# Patient Record
Sex: Female | Born: 1970 | Race: White | Hispanic: No | Marital: Single | Smoking: Former smoker
Health system: Southern US, Community
[De-identification: ages and names within clinical notes are randomized; demographics above are authoritative.]

## PROBLEM LIST (undated history)

## (undated) DIAGNOSIS — C801 Malignant (primary) neoplasm, unspecified: Secondary | ICD-10-CM

## (undated) DIAGNOSIS — T4145XA Adverse effect of unspecified anesthetic, initial encounter: Secondary | ICD-10-CM

## (undated) DIAGNOSIS — W540XXA Bitten by dog, initial encounter: Secondary | ICD-10-CM

## (undated) DIAGNOSIS — IMO0002 Reserved for concepts with insufficient information to code with codable children: Secondary | ICD-10-CM

## (undated) DIAGNOSIS — R87619 Unspecified abnormal cytological findings in specimens from cervix uteri: Secondary | ICD-10-CM

## (undated) DIAGNOSIS — Z923 Personal history of irradiation: Secondary | ICD-10-CM

## (undated) HISTORY — PX: ABDOMINAL HYSTERECTOMY: SHX81

## (undated) HISTORY — PX: TUBAL LIGATION: SHX77

## (undated) HISTORY — PX: ROBOTIC ASSISTED LAP VAGINAL HYSTERECTOMY: SHX2362

## (undated) HISTORY — DX: Malignant (primary) neoplasm, unspecified: C80.1

## (undated) HISTORY — DX: Unspecified abnormal cytological findings in specimens from cervix uteri: R87.619

## (undated) HISTORY — DX: Reserved for concepts with insufficient information to code with codable children: IMO0002

## (undated) HISTORY — DX: Bitten by dog, initial encounter: W54.0XXA

---

## 2001-07-07 ENCOUNTER — Other Ambulatory Visit: Admission: RE | Admit: 2001-07-07 | Discharge: 2001-07-07 | Payer: Self-pay | Admitting: Obstetrics and Gynecology

## 2002-08-12 ENCOUNTER — Other Ambulatory Visit: Admission: RE | Admit: 2002-08-12 | Discharge: 2002-08-12 | Payer: Self-pay | Admitting: Obstetrics and Gynecology

## 2003-08-15 ENCOUNTER — Other Ambulatory Visit: Admission: RE | Admit: 2003-08-15 | Discharge: 2003-08-15 | Payer: Self-pay | Admitting: Obstetrics and Gynecology

## 2004-11-22 ENCOUNTER — Other Ambulatory Visit: Admission: RE | Admit: 2004-11-22 | Discharge: 2004-11-22 | Payer: Self-pay | Admitting: Obstetrics and Gynecology

## 2005-12-29 ENCOUNTER — Other Ambulatory Visit: Admission: RE | Admit: 2005-12-29 | Discharge: 2005-12-29 | Payer: Self-pay | Admitting: Obstetrics and Gynecology

## 2006-12-22 ENCOUNTER — Other Ambulatory Visit: Admission: RE | Admit: 2006-12-22 | Discharge: 2006-12-22 | Payer: Self-pay | Admitting: Obstetrics and Gynecology

## 2007-12-23 ENCOUNTER — Other Ambulatory Visit: Admission: RE | Admit: 2007-12-23 | Discharge: 2007-12-23 | Payer: Self-pay | Admitting: Obstetrics & Gynecology

## 2008-10-02 ENCOUNTER — Ambulatory Visit (HOSPITAL_COMMUNITY): Admission: RE | Admit: 2008-10-02 | Discharge: 2008-10-02 | Payer: Self-pay | Admitting: Obstetrics and Gynecology

## 2009-11-02 ENCOUNTER — Encounter: Admission: RE | Admit: 2009-11-02 | Discharge: 2009-11-02 | Payer: Self-pay | Admitting: Obstetrics and Gynecology

## 2009-11-06 ENCOUNTER — Encounter: Admission: RE | Admit: 2009-11-06 | Discharge: 2009-11-06 | Payer: Self-pay | Admitting: Obstetrics and Gynecology

## 2010-10-02 LAB — CBC
MCHC: 35 g/dL (ref 30.0–36.0)
Platelets: 250 10*3/uL (ref 150–400)

## 2010-11-05 NOTE — Op Note (Signed)
NAME:  Catherine Santos, Catherine Santos NO.:  [id number]   MEDICAL RECORD NO.:  [id number]          PATIENT TYPE:  AMB   LOCATION:  SDC                           FACILITY:  [facility]   PHYSICIAN:  Cynthia P. Romine, M.D.DATE OF BIRTH:  [birth year]   DATE OF PROCEDURE:  10/02/2008  DATE OF DISCHARGE:                               OPERATIVE REPORT   PREOPERATIVE DIAGNOSIS:  Multiparity with desire for attempt at  permanent surgical sterilization.   POSTOPERATIVE DIAGNOSIS:  Multiparity with desire for attempt at  permanent surgical sterilization.   PROCEDURE:  Falope ring laparoscopic bilateral tubal sterilization  procedure.   SURGEON:  Cynthia P. Romine, MD   ANESTHESIA:  General endotracheal.   ESTIMATED BLOOD LOSS:  Minimal.   COMPLICATIONS:  None.   PROCEDURE IN DETAIL:  The patient was taken to the operating room and  after induction of adequate general endotracheal anesthesia, was placed  in dorsal lithotomy position and prepped and draped in the usual  fashion.  The bladder was drained with a red rubber catheter.  A  posterior weighted, and anterior Sims retractor were placed, and a Hulka  uterine manipulator was placed.  Attention was next turned to the  abdomen, and area just inside the umbilicus was infiltrated with 0.25%  Marcaine with epinephrine, incised with a knife, and a Veress needle was  then inserted into peritoneal space.  Proper placement was tested by  noting a negative aspirate, and free flow of saline through the Veress  needle again with a negative aspirate, and then by noting the response  of a drop of saline placed at the hub of the Veress needle to negative  pressure as the abdominal wall was elevated.  Pneumoperitoneum was  created with the automatic insufflator using the 3 L of CO2.  An area  just over the symphysis in the midline was infiltrated with Marcaine,  incised with a knife, an 8-mm trocar was inserted under direct  visualization.  Prior to  inserting the suprapubic trocar and after  insufflation, a disposable bladed 10/11 trocar was inserted into  peritoneal space and proper placement noted with the laparoscope.  The  pelvis was inspected and there was diffuse endometriosis in the anterior  cul-de-sac.  The peritoneum had vesicular lesions all across the  anterior peritoneum.  The posterior cul-de-sac was obliterated.  There  were multiple adhesions especially of the cervix to the posterior  peritoneum and to the posterior leaf of broad ligament to the ovaries.  The tubes were surprisingly mobile.  There was an anterior fibroid  approximately 2-3 cm.  Both ovaries were resected.  The upper abdomen,  however, was normal.  The gallbladder appeared normal.  The stomach was  normal and the liver was smooth.  The uterus was freely mobile.  Anteriorly, it was just a posterior cul-de-sac that was primarily  dissected with the adhesions.  Both tubes could be identified and traced  to their fimbriated end.  On the right, the thinnest area of the tube  mid isthmic was elevated and a Falope ring was placed.  A good  knuckle  of tube was noted to be contained within the ring and there was good  blanching.  Photographic documentation was taken.  Procedure was  repeated on the patient's left, identifying the tube and tracing it to  its fimbriated end, and then some portions were elevated and the Falope  ring was placed.  A good knuckle of tube was noted to be contained  within the ring and good blanching was noted.  The laparoscopic  instruments were then removed from the abdomen and pneumoperitoneum was  allowed to escape.  The trocar sleeves were removed.  The incisions were  closed subcuticularly with 3-0 Vicryl Rapide.  There was some vaginal  bleeding noted when the manipulator was removed.  Inspection revealed  there to be some bleeding from the  tenaculum site.  This was controlled with a figure-of-eight suture of 2-  0 Vicryl.  The  procedure was terminated.  Instruments removed from  vagina.  The patient was taken to the recovery room in satisfactory  condition.  Sponge, needle, and instrument counts were correct.       Cynthia P. Romine, M.D.  Electronically Signed     CPR/MEDQ  D:  10/02/2008  T:  10/02/2008  Job:  [id number]

## 2011-12-09 ENCOUNTER — Encounter (HOSPITAL_COMMUNITY)
Admission: RE | Admit: 2011-12-09 | Discharge: 2011-12-09 | Disposition: A | Discharge status: Home / Self Care | Payer: BC Managed Care – PPO | Source: Ambulatory Visit | Attending: Obstetrics & Gynecology | Admitting: Obstetrics & Gynecology

## 2011-12-09 ENCOUNTER — Encounter (HOSPITAL_COMMUNITY): Payer: Self-pay

## 2011-12-09 ENCOUNTER — Encounter (HOSPITAL_COMMUNITY): Payer: Self-pay | Admitting: Pharmacy Technician

## 2011-12-09 DIAGNOSIS — Z01812 Encounter for preprocedural laboratory examination: Secondary | ICD-10-CM | POA: Insufficient documentation

## 2011-12-09 HISTORY — DX: Adverse effect of unspecified anesthetic, initial encounter: T41.45XA

## 2011-12-09 LAB — CBC
Hemoglobin: 12.9 g/dL (ref 12.0–15.0)
MCH: 31.5 pg (ref 26.0–34.0)
MCHC: 35.1 g/dL (ref 30.0–36.0)
MCV: 90 fL (ref 78.0–100.0)
Platelets: 252 10*3/uL (ref 150–400)
RDW: 12.3 % (ref 11.5–15.5)
WBC: 7.5 10*3/uL (ref 4.0–10.5)

## 2011-12-09 NOTE — Patient Instructions (Addendum)
YOUR PROCEDURE IS SCHEDULED ON:01/05/12  ENTER THROUGH THE MAIN ENTRANCE OF [facility] AT:6am  USE DESK PHONE AND DIAL [phone] TO INFORM us OF YOUR ARRIVAL  CALL [phone] IF YOU HAVE ANY QUESTIONS OR PROBLEMS PRIOR TO YOUR ARRIVAL.  REMEMBER: DO NOT EAT OR DRINK AFTER MIDNIGHT :Sunday   YOU MAY BRUSH YOUR TEETH THE MORNING OF SURGERY   TAKE THESE MEDICINES THE DAY OF SURGERY WITH SIP OF WATER:none   DO NOT WEAR JEWELRY, EYE MAKEUP, LIPSTICK OR DARK FINGERNAIL POLISH DO NOT WEAR LOTIONS  DO NOT SHAVE FOR 48 HOURS PRIOR TO SURGERY

## 2011-12-24 DIAGNOSIS — N938 Other specified abnormal uterine and vaginal bleeding: Secondary | ICD-10-CM | POA: Diagnosis present

## 2011-12-24 DIAGNOSIS — N809 Endometriosis, unspecified: Secondary | ICD-10-CM | POA: Diagnosis present

## 2011-12-24 DIAGNOSIS — R102 Pelvic and perineal pain: Secondary | ICD-10-CM | POA: Diagnosis present

## 2011-12-29 MED ORDER — METRONIDAZOLE IN NACL 5-0.79 MG/ML-% IV SOLN
500.0000 mg | INTRAVENOUS | Status: AC
  Administered 2011-12-30: .5 g via INTRAVENOUS
  Filled 2011-12-29: qty 100

## 2011-12-29 MED ORDER — CIPROFLOXACIN IN D5W 400 MG/200ML IV SOLN
400.0000 mg | INTRAVENOUS | Status: AC
  Administered 2011-12-30: 400 mg via INTRAVENOUS
  Filled 2011-12-29: qty 200

## 2011-12-30 ENCOUNTER — Encounter (HOSPITAL_COMMUNITY): Payer: Self-pay | Admitting: Anesthesiology

## 2011-12-30 ENCOUNTER — Encounter (HOSPITAL_COMMUNITY): Payer: Self-pay | Admitting: *Deleted

## 2011-12-30 ENCOUNTER — Ambulatory Visit (HOSPITAL_COMMUNITY): Payer: BC Managed Care – PPO | Admitting: Anesthesiology

## 2011-12-30 ENCOUNTER — Ambulatory Visit (HOSPITAL_COMMUNITY)
Admission: RE | Admit: 2011-12-30 | Discharge: 2011-12-30 | Disposition: A | Discharge status: Home / Self Care | Payer: BC Managed Care – PPO | Source: Ambulatory Visit | Attending: Obstetrics & Gynecology | Admitting: Obstetrics & Gynecology

## 2011-12-30 ENCOUNTER — Encounter (HOSPITAL_COMMUNITY)
Admission: RE | Disposition: A | Discharge status: Home / Self Care | Payer: Self-pay | Source: Ambulatory Visit | Attending: Obstetrics & Gynecology

## 2011-12-30 DIAGNOSIS — N938 Other specified abnormal uterine and vaginal bleeding: Secondary | ICD-10-CM | POA: Diagnosis present

## 2011-12-30 DIAGNOSIS — N920 Excessive and frequent menstruation with regular cycle: Secondary | ICD-10-CM | POA: Insufficient documentation

## 2011-12-30 DIAGNOSIS — R102 Pelvic and perineal pain: Secondary | ICD-10-CM | POA: Diagnosis present

## 2011-12-30 DIAGNOSIS — N8003 Adenomyosis of the uterus: Secondary | ICD-10-CM | POA: Diagnosis present

## 2011-12-30 DIAGNOSIS — Z01812 Encounter for preprocedural laboratory examination: Secondary | ICD-10-CM | POA: Insufficient documentation

## 2011-12-30 DIAGNOSIS — N809 Endometriosis, unspecified: Secondary | ICD-10-CM | POA: Diagnosis present

## 2011-12-30 DIAGNOSIS — N8000 Endometriosis of the uterus, unspecified: Secondary | ICD-10-CM | POA: Insufficient documentation

## 2011-12-30 DIAGNOSIS — Z01818 Encounter for other preprocedural examination: Secondary | ICD-10-CM | POA: Insufficient documentation

## 2011-12-30 DIAGNOSIS — N946 Dysmenorrhea, unspecified: Secondary | ICD-10-CM | POA: Insufficient documentation

## 2011-12-30 HISTORY — PX: SALPINGOOPHORECTOMY: SHX82

## 2011-12-30 HISTORY — PX: CYSTOSCOPY: SHX5120

## 2011-12-30 SURGERY — ROBOTIC ASSISTED TOTAL HYSTERECTOMY
Anesthesia: General | Site: Bladder | Laterality: Right | Wound class: Clean Contaminated

## 2011-12-30 MED ORDER — HYDROMORPHONE HCL PF 1 MG/ML IJ SOLN
INTRAMUSCULAR | Status: AC
  Filled 2011-12-30: qty 1

## 2011-12-30 MED ORDER — ACETAMINOPHEN 325 MG PO TABS: 650.0000 mg | ORAL_TABLET | ORAL | Status: DC | PRN

## 2011-12-30 MED ORDER — ROCURONIUM BROMIDE 100 MG/10ML IV SOLN
INTRAVENOUS | Status: DC | PRN
  Administered 2011-12-30: 50 mg via INTRAVENOUS
  Administered 2011-12-30: 10 mg via INTRAVENOUS
  Administered 2011-12-30: 20 mg via INTRAVENOUS
  Administered 2011-12-30: 10 mg via INTRAVENOUS

## 2011-12-30 MED ORDER — HYDROMORPHONE HCL PF 1 MG/ML IJ SOLN
INTRAMUSCULAR | Status: DC | PRN
  Administered 2011-12-30: 1 mg via INTRAVENOUS

## 2011-12-30 MED ORDER — LACTATED RINGERS IR SOLN
Status: DC | PRN
  Administered 2011-12-30: 3000 mL

## 2011-12-30 MED ORDER — ROPIVACAINE HCL 5 MG/ML IJ SOLN
INTRAMUSCULAR | Status: DC | PRN
  Administered 2011-12-30: 60 mL

## 2011-12-30 MED ORDER — ONDANSETRON HCL 4 MG/2ML IJ SOLN
INTRAMUSCULAR | Status: AC
  Filled 2011-12-30: qty 2

## 2011-12-30 MED ORDER — MIDAZOLAM HCL 2 MG/2ML IJ SOLN
INTRAMUSCULAR | Status: AC
  Filled 2011-12-30: qty 2

## 2011-12-30 MED ORDER — PROPOFOL 10 MG/ML IV EMUL
INTRAVENOUS | Status: AC
  Filled 2011-12-30: qty 20

## 2011-12-30 MED ORDER — SODIUM CHLORIDE 0.9 % IJ SOLN
INTRAMUSCULAR | Status: DC | PRN
  Administered 2011-12-30: 60 mL

## 2011-12-30 MED ORDER — HYDROMORPHONE HCL PF 1 MG/ML IJ SOLN
0.2500 mg | INTRAMUSCULAR | Status: DC | PRN
  Administered 2011-12-30: 0.25 mg via INTRAVENOUS

## 2011-12-30 MED ORDER — DEXAMETHASONE SODIUM PHOSPHATE 10 MG/ML IJ SOLN
INTRAMUSCULAR | Status: AC
  Filled 2011-12-30: qty 1

## 2011-12-30 MED ORDER — KETOROLAC TROMETHAMINE 30 MG/ML IJ SOLN: 30.0000 mg | Freq: Four times a day (QID) | INTRAMUSCULAR | Status: DC

## 2011-12-30 MED ORDER — GLYCOPYRROLATE 0.2 MG/ML IJ SOLN
INTRAMUSCULAR | Status: AC
  Filled 2011-12-30: qty 1

## 2011-12-30 MED ORDER — ROPIVACAINE HCL 5 MG/ML IJ SOLN
INTRAMUSCULAR | Status: AC
  Filled 2011-12-30: qty 60

## 2011-12-30 MED ORDER — DEXAMETHASONE SODIUM PHOSPHATE 4 MG/ML IJ SOLN
INTRAMUSCULAR | Status: DC | PRN
  Administered 2011-12-30: 5 mg via INTRAVENOUS

## 2011-12-30 MED ORDER — SCOPOLAMINE 1 MG/3DAYS TD PT72
1.0000 | MEDICATED_PATCH | TRANSDERMAL | Status: DC
  Administered 2011-12-30: 1.5 mg via TRANSDERMAL

## 2011-12-30 MED ORDER — STERILE WATER FOR IRRIGATION IR SOLN
Status: DC | PRN
  Administered 2011-12-30: 1000 mL via INTRAVESICAL

## 2011-12-30 MED ORDER — LACTATED RINGERS IV SOLN
INTRAVENOUS | Status: DC
  Administered 2011-12-30 (×3): via INTRAVENOUS

## 2011-12-30 MED ORDER — DEXTROSE-NACL 5-0.45 % IV SOLN
INTRAVENOUS | Status: DC
  Administered 2011-12-30: 19:00:00 via INTRAVENOUS

## 2011-12-30 MED ORDER — LIDOCAINE HCL (CARDIAC) 20 MG/ML IV SOLN
INTRAVENOUS | Status: DC | PRN
  Administered 2011-12-30: 80 mg via INTRAVENOUS

## 2011-12-30 MED ORDER — KETOROLAC TROMETHAMINE 30 MG/ML IJ SOLN
INTRAMUSCULAR | Status: DC | PRN
  Administered 2011-12-30: 30 mg via INTRAVENOUS

## 2011-12-30 MED ORDER — NEOSTIGMINE METHYLSULFATE 1 MG/ML IJ SOLN
INTRAMUSCULAR | Status: DC | PRN
  Administered 2011-12-30: 3 mg via INTRAVENOUS

## 2011-12-30 MED ORDER — PANTOPRAZOLE SODIUM 40 MG IV SOLR
40.0000 mg | Freq: Every day | INTRAVENOUS | Status: DC
  Filled 2011-12-30: qty 40

## 2011-12-30 MED ORDER — KETOROLAC TROMETHAMINE 30 MG/ML IJ SOLN
INTRAMUSCULAR | Status: AC
  Filled 2011-12-30: qty 1

## 2011-12-30 MED ORDER — MENTHOL 3 MG MT LOZG: 1.0000 | LOZENGE | OROMUCOSAL | Status: DC | PRN

## 2011-12-30 MED ORDER — GLYCOPYRROLATE 0.2 MG/ML IJ SOLN
INTRAMUSCULAR | Status: DC | PRN
  Administered 2011-12-30: 0.4 mg via INTRAVENOUS

## 2011-12-30 MED ORDER — ONDANSETRON HCL 4 MG/2ML IJ SOLN
INTRAMUSCULAR | Status: DC | PRN
  Administered 2011-12-30: 4 mg via INTRAVENOUS

## 2011-12-30 MED ORDER — MIDAZOLAM HCL 5 MG/5ML IJ SOLN
INTRAMUSCULAR | Status: DC | PRN
  Administered 2011-12-30: 2 mg via INTRAVENOUS

## 2011-12-30 MED ORDER — INDIGOTINDISULFONATE SODIUM 8 MG/ML IJ SOLN
INTRAMUSCULAR | Status: AC
  Filled 2011-12-30: qty 5

## 2011-12-30 MED ORDER — LIDOCAINE HCL (CARDIAC) 20 MG/ML IV SOLN
INTRAVENOUS | Status: AC
  Filled 2011-12-30: qty 5

## 2011-12-30 MED ORDER — MEPERIDINE HCL 25 MG/ML IJ SOLN: 6.2500 mg | INTRAMUSCULAR | Status: DC | PRN

## 2011-12-30 MED ORDER — OXYCODONE-ACETAMINOPHEN 5-325 MG PO TABS: 1.0000 | ORAL_TABLET | ORAL | Status: DC | PRN

## 2011-12-30 MED ORDER — SIMETHICONE 80 MG PO CHEW: 80.0000 mg | CHEWABLE_TABLET | Freq: Four times a day (QID) | ORAL | Status: DC | PRN

## 2011-12-30 MED ORDER — FENTANYL CITRATE 0.05 MG/ML IJ SOLN
INTRAMUSCULAR | Status: DC | PRN
  Administered 2011-12-30: 50 ug via INTRAVENOUS
  Administered 2011-12-30: 100 ug via INTRAVENOUS
  Administered 2011-12-30 (×2): 50 ug via INTRAVENOUS

## 2011-12-30 MED ORDER — TEMAZEPAM 15 MG PO CAPS: 15.0000 mg | ORAL_CAPSULE | Freq: Every evening | ORAL | Status: DC | PRN

## 2011-12-30 MED ORDER — FENTANYL CITRATE 0.05 MG/ML IJ SOLN
INTRAMUSCULAR | Status: AC
  Filled 2011-12-30: qty 5

## 2011-12-30 MED ORDER — MORPHINE SULFATE 4 MG/ML IJ SOLN: 1.0000 mg | INTRAMUSCULAR | Status: DC | PRN

## 2011-12-30 MED ORDER — ALUM & MAG HYDROXIDE-SIMETH 200-200-20 MG/5ML PO SUSP: 30.0000 mL | ORAL | Status: DC | PRN

## 2011-12-30 MED ORDER — ROCURONIUM BROMIDE 50 MG/5ML IV SOLN
INTRAVENOUS | Status: AC
  Filled 2011-12-30: qty 1

## 2011-12-30 MED ORDER — SCOPOLAMINE 1 MG/3DAYS TD PT72
MEDICATED_PATCH | TRANSDERMAL | Status: AC
  Administered 2011-12-30: 1.5 mg via TRANSDERMAL
  Filled 2011-12-30: qty 1

## 2011-12-30 MED ORDER — METOCLOPRAMIDE HCL 5 MG/ML IJ SOLN: 10.0000 mg | Freq: Once | INTRAMUSCULAR | Status: DC | PRN

## 2011-12-30 MED ORDER — PROPOFOL 10 MG/ML IV EMUL
INTRAVENOUS | Status: DC | PRN
  Administered 2011-12-30: 200 mg via INTRAVENOUS
  Administered 2011-12-30: 50 mg via INTRAVENOUS

## 2011-12-30 SURGICAL SUPPLY — 68 items
ADH SKN CLS APL DERMABOND .7 (GAUZE/BANDAGES/DRESSINGS) ×4
APL SKNCLS STERI-STRIP NONHPOA (GAUZE/BANDAGES/DRESSINGS)
BAG URINE DRAINAGE (UROLOGICAL SUPPLIES) ×5 IMPLANT
BARRIER ADHS 3X4 INTERCEED (GAUZE/BANDAGES/DRESSINGS) ×10 IMPLANT
BENZOIN TINCTURE PRP APPL 2/3 (GAUZE/BANDAGES/DRESSINGS) IMPLANT
BRR ADH 4X3 ABS CNTRL BYND (GAUZE/BANDAGES/DRESSINGS) ×8
CABLE HIGH FREQUENCY MONO STRZ (ELECTRODE) ×5 IMPLANT
CATH FOLEY 3WAY  5CC 16FR (CATHETERS)
CATH FOLEY 3WAY 5CC 16FR (CATHETERS) IMPLANT
CHLORAPREP W/TINT 26ML (MISCELLANEOUS) ×5 IMPLANT
CLOTH BEACON ORANGE TIMEOUT ST (SAFETY) ×5 IMPLANT
CONT PATH 16OZ SNAP LID 3702 (MISCELLANEOUS) ×5 IMPLANT
COVER MAYO STAND STRL (DRAPES) ×5 IMPLANT
COVER TABLE BACK 60X90 (DRAPES) ×10 IMPLANT
COVER TIP SHEARS 8 DVNC (MISCELLANEOUS) ×8 IMPLANT
COVER TIP SHEARS 8MM DA VINCI (MISCELLANEOUS) ×2
DECANTER SPIKE VIAL GLASS SM (MISCELLANEOUS) ×5 IMPLANT
DERMABOND ADVANCED (GAUZE/BANDAGES/DRESSINGS) ×1
DERMABOND ADVANCED .7 DNX12 (GAUZE/BANDAGES/DRESSINGS) ×4 IMPLANT
DRAPE HUG U DISPOSABLE (DRAPE) ×5 IMPLANT
DRAPE LG THREE QUARTER DISP (DRAPES) ×10 IMPLANT
DRAPE MONITOR DA VINCI (DRAPE) IMPLANT
DRAPE PROXIMA HALF (DRAPES) ×5 IMPLANT
DRAPE WARM FLUID 44X44 (DRAPE) ×5 IMPLANT
ELECT REM PT RETURN 9FT ADLT (ELECTROSURGICAL) ×5
ELECTRODE REM PT RTRN 9FT ADLT (ELECTROSURGICAL) ×4 IMPLANT
EVACUATOR SMOKE 8.L (FILTER) ×5 IMPLANT
GAUZE VASELINE 3X9 (GAUZE/BANDAGES/DRESSINGS) ×5 IMPLANT
GLOVE BIOGEL PI IND STRL 7.0 (GLOVE) ×32 IMPLANT
GLOVE BIOGEL PI INDICATOR 7.0 (GLOVE) ×8
GLOVE ECLIPSE 6.5 STRL STRAW (GLOVE) ×25 IMPLANT
GLOVE SURG SS PI 7.0 STRL IVOR (GLOVE) ×15 IMPLANT
GOWN STRL REIN XL XLG (GOWN DISPOSABLE) ×40 IMPLANT
KIT ACCESSORY DA VINCI DISP (KITS) ×1
KIT ACCESSORY DVNC DISP (KITS) ×4 IMPLANT
KIT DISP ACCESSORY 4 ARM (KITS) IMPLANT
LEGGING LITHOTOMY PAIR STRL (DRAPES) ×10 IMPLANT
NEEDLE INSUFFLATION 14GA 120MM (NEEDLE) ×5 IMPLANT
OCCLUDER COLPOPNEUMO (BALLOONS) ×5 IMPLANT
PACK LAVH (CUSTOM PROCEDURE TRAY) ×5 IMPLANT
PAD PREP 24X48 CUFFED NSTRL (MISCELLANEOUS) ×10 IMPLANT
PLUG CATH AND CAP STER (CATHETERS) ×5 IMPLANT
PROTECTOR NERVE ULNAR (MISCELLANEOUS) ×10 IMPLANT
SET CYSTO W/LG BORE CLAMP LF (SET/KITS/TRAYS/PACK) ×5 IMPLANT
SET IRRIG TUBING LAPAROSCOPIC (IRRIGATION / IRRIGATOR) ×5 IMPLANT
SOLUTION ELECTROLUBE (MISCELLANEOUS) ×5 IMPLANT
SPONGE LAP 18X18 X RAY DECT (DISPOSABLE) IMPLANT
STRIP CLOSURE SKIN 1/4X4 (GAUZE/BANDAGES/DRESSINGS) IMPLANT
SUT VIC AB 0 CT1 27 (SUTURE) ×20
SUT VIC AB 0 CT1 27XBRD ANBCTR (SUTURE) ×20 IMPLANT
SUT VICRYL 0 UR6 27IN ABS (SUTURE) ×5 IMPLANT
SUT VICRYL RAPIDE 4/0 PS 2 (SUTURE) ×10 IMPLANT
SUT VLOC 180 0 9IN  GS21 (SUTURE) ×1
SUT VLOC 180 0 9IN GS21 (SUTURE) ×4 IMPLANT
SYR 50ML LL SCALE MARK (SYRINGE) ×5 IMPLANT
SYSTEM CONVERTIBLE TROCAR (TROCAR) IMPLANT
TIP UTERINE 5.1X6CM LAV DISP (MISCELLANEOUS) IMPLANT
TIP UTERINE 6.7X10CM GRN DISP (MISCELLANEOUS) IMPLANT
TIP UTERINE 6.7X6CM WHT DISP (MISCELLANEOUS) IMPLANT
TIP UTERINE 6.7X8CM BLUE DISP (MISCELLANEOUS) ×5 IMPLANT
TOWEL OR 17X24 6PK STRL BLUE (TOWEL DISPOSABLE) ×10 IMPLANT
TROCAR DISP BLADELESS 8 DVNC (TROCAR) ×4 IMPLANT
TROCAR DISP BLADELESS 8MM (TROCAR) ×1
TROCAR XCEL NON-BLD 5MMX100MML (ENDOMECHANICALS) ×5 IMPLANT
TROCAR Z-THREAD 12X150 (TROCAR) ×5 IMPLANT
TUBING FILTER THERMOFLATOR (ELECTROSURGICAL) ×5 IMPLANT
WARMER LAPAROSCOPE (MISCELLANEOUS) ×5 IMPLANT
WATER STERILE IRR 1000ML POUR (IV SOLUTION) ×15 IMPLANT

## 2011-12-30 NOTE — Progress Notes (Signed)
Day of Surgery Procedure(s) (LRB): ROBOTIC ASSISTED TOTAL HYSTERECTOMY (N/A) SALPINGO OOPHERECTOMY (Left) UNILATERAL SALPINGECTOMY (Right) CYSTOSCOPY (N/A)  Subjective: Patient reports tolerating PO.  Good pain control.  No nausea.  Has not been able to void yet.  Objective: I have reviewed patient's vital signs, intake and output and medications.  General: alert and cooperative Resp: clear to auscultation bilaterally Cardio: regular rate and rhythm, S1, S2 normal, no murmur, click, rub or gallop GI: soft, non-tender; bowel sounds normal; no masses,  no organomegaly Extremities: extremities normal, atraumatic, no cyanosis or edema Vaginal Bleeding: none Inc:  clean/dry/intact  Assessment: s/p Procedure(s) (LRB): ROBOTIC ASSISTED TOTAL HYSTERECTOMY (N/A) SALPINGO OOPHERECTOMY (Left) UNILATERAL SALPINGECTOMY (Right) CYSTOSCOPY (N/A): stable, progressing well and tolerating diet  Plan: Encourage ambulation Advance to PO medication Discontinue IV fluids Discharge home later tonight if patient meets all criteria for discharge.  LOS: 0 days    Valentina Shaggy [facility] 12/30/2011, 6:30 PM

## 2011-12-30 NOTE — Anesthesia Postprocedure Evaluation (Signed)
  Anesthesia Post-op Note  Patient: Catherine Santos  Procedure(s) Performed: Procedure(s) (LRB): ROBOTIC ASSISTED TOTAL HYSTERECTOMY (N/A) SALPINGO OOPHERECTOMY (Left) UNILATERAL SALPINGECTOMY (Right) CYSTOSCOPY (N/A) Patient is awake and responsive. Pain and nausea are reasonably well controlled. Vital signs are stable and clinically acceptable. Oxygen saturation is clinically acceptable. There are no apparent anesthetic complications at this time. Patient is ready for discharge.

## 2011-12-30 NOTE — Progress Notes (Signed)
After several attempts to page Dr. Hyacinth Meeker, unable to talk with MD until now.  Patient discharged home with mother.  Discharge instructions reviewed with patient and mother, verbalized understanding of instructions.  D/C home via wheel chair.  Denies any pain at this time.

## 2011-12-30 NOTE — Transfer of Care (Signed)
Immediate Anesthesia Transfer of Care Note  Patient: Catherine Santos  Procedure(s) Performed: Procedure(s) (LRB): ROBOTIC ASSISTED TOTAL HYSTERECTOMY (N/A) SALPINGO OOPHERECTOMY (Left) UNILATERAL SALPINGECTOMY (Right) CYSTOSCOPY (N/A)  Patient Location: PACU  Anesthesia Type: General  Level of Consciousness: awake, alert  and oriented  Airway & Oxygen Therapy: Patient Spontanous Breathing and Patient connected to nasal cannula oxygen  Post-op Assessment: Report given to PACU RN and Post -op Vital signs reviewed and stable  Post vital signs: stable  Complications: No apparent anesthesia complications

## 2011-12-30 NOTE — Op Note (Signed)
12/30/2011  3:12 PM  PATIENT:  Catherine Santos  41 y.o. female G2P1 SWF with history of menorrhagia, dysmenorrhea, known history of endometriosis, findings consistent with adenomyosis noted on in-office ultrasound  PRE-OPERATIVE DIAGNOSIS:  ENDOMETRIOSIS, Dysfunctional Uterine Bleeding   POST-OPERATIVE DIAGNOSIS:  ENDOMETRIOSIS, Dysfunctional Uterine Bleeding   PROCEDURE:  Procedure(s): ROBOTIC ASSISTED TOTAL HYSTERECTOMY SALPINGO OOPHERECTOMY UNILATERAL SALPINGECTOMY CYSTOSCOPY  SURGEON:  Ermagene Saidi SUZANNE  ASSISTANTS: CYNTHIA ROMINE, MC   ANESTHESIA:   general  ESTIMATED BLOOD LOSS: 100cc  BLOOD ADMINISTERED:none   FLUIDS: 1500ccLR  UOP: 650cc clear urine  SPECIMEN:  Uterus, cervix, right salpingectomy, left salpingo-oophrectomy, cystocopy  DISPOSITION OF SPECIMEN:  PATHOLOGY  FINDINGS: endometriosis incorporating the left tube and ovary with adhesions to the left sidewall and left side of the uterus, cul de sac adhesions, two small areas on the peritoneum, normal right ovary and tube, normal bladder, normal upper abdomen including liver, stomach edge, and gall bladder  DESCRIPTION OF OPERATION: Patient is taken to the operating room. She was placed in supine position. General endotracheal anesthesia was administered by the anesthesia staff without difficulty. The patient was on a beanbag. After anesthesia was administered on her by her side and the beanbag was inflated. Legs are positioned in the low lithotomy position in [place] stirrups. SCDs were functioning properly and on her lower extremities bilaterally.  Timeout was performed. Chlor prep was used to prep the abdomen and Betadine was used to prep the inner thighs perineum, and vagina x3. Once 3 minutes had passed the patient was draped in a normal standard fashion. Legs were lifted to the high lithotomy position and attention was turned towards the vagina. A heavy weighted speculum was placed in the posterior aspect of  the vagina. The anterior lip of the cervix was grasped with single-tooth tenaculum. A single stitch of 2-0 Vicryl was placed on each side of the cervix. The cervix sounded to 9 cm. Using [facility] dilators the cervix was dilated up to a #21. A #8 disposable tip was obtained. This was attached to the RUMI uterine manipulator as well as a vaginal occlusive device. A medium KOH ring was also attached. The cell advanced to the cervical os and into the endometrial cavity. 10 cc of normal saline was used to inflate the balloon on the disposable tip. Was excellent fit around the cervix. The 2 sutures on each side of the cervix were then brought through the KOH ring as a means of retraction on the cervix and uterus during the procedure. The heavy weighted speculum was removed as well as the single-tooth tenaculum. A Foley was placed to straight drain. Legs were lowered to the low lithotomy position and attention was turned to the abdomen. Next  0.5% ropivacaine mixture (mixed one-to-one with normal saline) was used anesthetize the skin. A 10 mm skin incision was made above the umbilicus with a #11 blade. The abdomen was elevated and a Veress needle was inserted directly into the abdomen. The stopcock was open. A syringe of normal saline was attached to the needle and an aspiration was performed. No blood or fluid was noted. Fluid injected easily into the needle. A second aspiration was performed. No blood, fluid, or saline was noted. Fluid dripped easily into the needle. A syringe was removed and CO2 gas was attached. Under low flows a pneumoperitoneum was achieved. Once 3 L of gas was in the abdomen the needle was removed. With the abdomen elevated a #11 bladed trocar port passed rectally into the abdomen  without difficulty. The laparoscope was used to confirm excellent placement without any injury to bowel or other pelvic organs.  The abdominal wall was transilluminated and port location sites were chosen. A #1 site was  chosen 10 cm to the right of the umbilicus and #2 site was chosen 10 cm to the left of the umbilicus. These locations were anesthetized with ropivacaine mixture and 8mm skin incisions were made with a #11 blade. Then 2 #8 nondisposable trochars and ports were placed in these incisions. These were to be used for the #1 and #2 arms of the robotic arm. Finally a right lower quadrant port site was chosen. The skin was anesthetized after transillumination. A 5 mm skin incision was made with a #11 blade and a 5 mm disposable non-bladed trocar port passed under direct visualization of the laparoscope into the right lower quadrant. The patient's table was then placed on the floor and she was placed in Trendelenburg positioning. The robot was docked on the left side a normal standard fashion.  And a #1 arm was placed endoscopic scissors with monopolar cautery attached and then the #2 arm was placed bipolar cautery attached a PK [place]. The ureters were noted bilaterally. The right ovary and tube were freely mobile. There were adhesions from the posterior cul-de-sac to the backside of the cervix. The left ovary and tube were adhesed left sidewall. The uterus placed on stretch to the right and attention was turned to the left side as this was obviously more difficult side. The left round ligament was serially clamped cauterized and incised. The from the round ligament slightly above the IP ligament was incised. The avascular tissue beneath this was dissected until ureter was noted. Then the left IP ligament was isolated. It was clamped cauterized and incised with bipolar cautery. With careful dissection the left ovary and tube were dissected off the left sidewall keeping note for the ureter was located. Then the peritoneum of the inferior leaf of the broad ligament was opened anteriorly bringing this across to the midline of the cervix. The beginning of the bladder flap was created by dissecting into the avascular plane of  the pubovesicocervical fascia. The white glistening tissue of the cervix was well visualized. The posterior peritoneum was incorporated partly into this adhesion of the posterior cul-de-sac to the posterior aspect of the uterus. This was taken down sharply. The colon was not close to this dissection. Then the left uterine artery to be skeletonized. Once it was skeletonized, it was clamped at the upper edge of the KOH ring. In several locations the uterine artery was clamped cauterized and then ultimately incised. There was excellent hemostasis as pedicle. At this point the left side was completely freed and attention was turned to the right side. Next  The uterus placed on stretch to the left and the ureter was noted on the right side. The tube was elevated and the tube was excised off of the ovary with monopolar cautery and sharp dissection as necessary. In the mesosalpinx was serially clamped cauterized and incised up to the level of the cornu of the uterus freeing the tube from the ovary completely. In the right uterine ovarian pedicle was serially clamped cauterized and incised. And the right round ligament was then serially clamped, cauterized, and incised. The inferiorly for the broad ligament on the side was opened anteriorly down to the level of the internal os of the cervix. The remainder of the bladder flap was created. In the posterior peritoneum was  carried down to the uterosacral ligament on the right side. There were also adhesions from the cul-de-sac up to the posterior aspect of cervix. These were taken down sharply and again we were not anywhere near the colon. At this point bulging of the vaginal mucosa due to the KOH ring was noted circumferentially around the cervix. Attention was then turned back to the right side of the cervix and the right uterine artery was skeletonized. At the upper edge of the KOH ring this vessel was serially clamped cauterized and incised. Excellent hemostasis was noted  on this pedicle as well. At this point the uterus was devascularized.  The colpotomy was started on the right side of the uterus. This was kept on the superior edge of the KOH ring. Using monopolar cautery the colpotomy was created by working circumferentially around the cervix. Once the colpotomy was completed the uterus cervix right tube and left tube and ovary were delivered intact to the vagina. This was used to occlude the vagina as remainder of the procedure was completed. There is some bleeding noted the along the inferior edge of the vaginal mucosa which was made hemostatic with bipolar cautery. Instruments were changed to the suture cut needle driver in the #1 arm and the needle nose graspers in #2 arm.  Then the vagina was closed with a running stitch of a 9 inch V lock suture. The anterior vaginal mucosa, anterior vaginal peritoneum, posterior vaginal peritoneum, and posterior vaginal mucosa was incorporated into each stitch. Once the cuff was closed stitch was brought back 2 more times and cut flush with the top of the vaginal mucosa. The pelvis was irrigated. Excellent hemostasis was noted. There were 2 small tiny bleeders noted on the edge of the bladder dissection these were made hemostatic with monopolar cautery. The endoscopic scissors were placed back in the #1 arm to accomplish this.  The upper pedicles were visualized no bleeding was noted. The ureters were noted to be peristalsing bilaterally.  An Interceed was placed across vaginal cuff to prevent adhesions and a second one was wrapped around the right ovary to prevent adhesions. At this point all pedicles and the pelvis appeared hemostatic. The instruments were removed.  The robot was undocked and the patient was taken out of Trendelenburg positioning. The V. lock stitch and parked on the pelvic sidewall was removed under direct visualization of the laparoscope. Then the ports were removed under direct visualization of the laparoscope. I  laparoscope was removed. The pneumoperitoneum was relieved. The CRNA give the patient several deep breaths to try and get any additional gas the abdomen. The midline port was removed. This incision was closed at the fascial level with figure-of-eight suture of #0 Vicryl. All incisions were cleansed and closed at the skin level with a subcuticular stitch of 3-0 Vicryl. The incisions were cleansed and dried and Dermabond was applied.  The Foley catheter was removed. A cystoscopy was then performed. A 30 scope was used to visualize and the bladder. An amp of indigo carmine had been given intravenously by the CRNA. Blue urine was noted to be peristalsing bilaterally from the ureteral orifices. There is no evidence of any bladder damage. There was no blanching of any of the bladder mucosa and no stitches were noted. The cystoscopy was ended and the bladder was drained of all irrigant.  A sponge stick was used to select the vagina a small amount of blood was noted. This did not appear to be active. At this point  the prep was cleansed of the patient's skin. Her legs are placed back in the supine position. Sponge, laps, needle, and instrument counts were correct x2. The patient did well during the surgery and no complications. She was awakened from anesthesia and extubated in stable condition. She was and taken to recovery room.  COUNTS:  YES  PLAN OF CARE: Transfer to PACU

## 2011-12-30 NOTE — H&P (Addendum)
Catherine Santos is an 41 y.o. female G2P1 SWF with history of menorrhagia, dysmenorrhea, chronic pelvic pain with known endometriosis.  She underwent a falope ring placement in April, 2010, showing endometriosis especially in the posterior cul de sac with colonic adhesions to the posterior side of the cervix and uterus.  She had undergone an ultrasound within the last year showing a uterus measuring 9.2 x 4.2 x 5.6cm.  There are findings consistent with adenomyosis.  She has been counseled about options for treatment:  OCPs, progestin options, and surgical maangment.  She has opted for definitive maangment.  We had a very frank discussion regarding risks, specifically colonic injury with possible colostomy placement if this occurs.  She has voiced clear understanding of risks (which are all documented in my office chart) and is ready to proceed.  Pertinent Gynecological History: Menses: regular but heavy Bleeding: menorrhagia Contraception: tubal ligation DES exposure: denies Blood transfusions: none Sexually transmitted diseases: no past history Previous GYN Procedures: cryo  Last mammogram: N/A Date: N/A Last pap: normal Date: 6/13 OB History: G2, P1   Menstrual History: Menarche age: 70 No LMP recorded.    Past Medical History  Diagnosis Date  . Complication of anesthesia   . No pertinent past medical history     Past Surgical History  Procedure Date  . Tubal ligation     No family history on file.  Social History:  reports that she has never smoked. She does not have any smokeless tobacco history on file. She reports that she drinks alcohol. She reports that she does not use illicit drugs.  Allergies:  Allergies  Allergen Reactions  . Penicillins Other (See Comments)    Pt doesn't remember reaction to PCN- was in childhood  . Sulfa Antibiotics Rash    No prescriptions prior to admission    Review of Systems  Constitutional: Negative for fever and chills.  Eyes:  Negative for blurred vision and double vision.  Respiratory: Negative for cough.   Cardiovascular: Negative for chest pain and palpitations.  Gastrointestinal: Negative for heartburn, nausea, vomiting and abdominal pain.  Genitourinary: Negative for dysuria.  Musculoskeletal: Negative for myalgias.  Skin: Negative for rash.  Neurological: Negative for dizziness and headaches.  Endo/Heme/Allergies: Does not bruise/bleed easily.  Psychiatric/Behavioral: Negative for depression.    Height 5\' 5"  (1.651 m), weight 79.379 kg (175 lb). Physical Exam  Vitals reviewed. Constitutional: She is oriented to person, place, and time. She appears well-developed and well-nourished.  HENT:  Head: Normocephalic and atraumatic.  Neck: Normal range of motion. Neck supple.  Cardiovascular: Normal rate and regular rhythm.   Respiratory: Effort normal and breath sounds normal.  GI: Soft. Bowel sounds are normal.  Musculoskeletal: Normal range of motion.  Neurological: She is alert and oriented to person, place, and time.  Skin: Skin is warm and dry.  Psychiatric: She has a normal mood and affect.    No results found for this or any previous visit (from the past 24 hour(s)).  No results found.  Assessment/Plan: 41 year old G2P1 SWF with menorrhagia, dysmenorrhea and known endometriosis here for definitive management via TLH/possible BSO.  She does understand there is a risk of losing both ovaries but I will try to preserve one if possible.  Risks and benefits were discussed in my office and are documented in my office chart.  Patient's mother is here as well.  All questions answered.  She is ready to proceed.  There is no change to her H&P.  Valentina Shaggy SUZANNE 12/30/2011, 7:20 AM

## 2011-12-30 NOTE — Anesthesia Preprocedure Evaluation (Signed)
Anesthesia Evaluation  Patient identified by MRN, date of birth, ID band Patient awake    Reviewed: Allergy & Precautions, H&P , NPO status , Patient's Chart, lab work & pertinent test results  Airway Mallampati: II TM Distance: >3 FB Neck ROM: Full    Dental No notable dental hx. (+) Teeth Intact   Pulmonary neg pulmonary ROS,  breath sounds clear to auscultation  Pulmonary exam normal       Cardiovascular negative cardio ROS  Rhythm:Regular Rate:Normal     Neuro/Psych negative neurological ROS  negative psych ROS   GI/Hepatic negative GI ROS, Neg liver ROS,   Endo/Other  negative endocrine ROS  Renal/GU negative Renal ROS  negative genitourinary   Musculoskeletal negative musculoskeletal ROS (+)   Abdominal   Peds  Hematology negative hematology ROS (+)   Anesthesia Other Findings   Reproductive/Obstetrics Adenomyosis DUB                           Anesthesia Physical Anesthesia Plan  ASA: I  Anesthesia Plan: General   Post-op Pain Management:    Induction: Intravenous  Airway Management Planned: Oral ETT  Additional Equipment:   Intra-op Plan:   Post-operative Plan: Extubation in OR  Informed Consent: I have reviewed the patients History and Physical, chart, labs and discussed the procedure including the risks, benefits and alternatives for the proposed anesthesia with the patient or authorized representative who has indicated his/her understanding and acceptance.   Dental advisory given  Plan Discussed with: CRNA, Anesthesiologist and Surgeon  Anesthesia Plan Comments:         Anesthesia Quick Evaluation

## 2011-12-31 ENCOUNTER — Encounter (HOSPITAL_COMMUNITY): Payer: Self-pay | Admitting: Obstetrics & Gynecology

## 2012-11-06 ENCOUNTER — Encounter (HOSPITAL_COMMUNITY): Payer: Self-pay | Admitting: Emergency Medicine

## 2012-11-06 ENCOUNTER — Emergency Department (HOSPITAL_COMMUNITY)
Admission: EM | Admit: 2012-11-06 | Discharge: 2012-11-06 | Disposition: A | Discharge status: Home / Self Care | Payer: BC Managed Care – PPO | Source: Home / Self Care | Attending: Emergency Medicine | Admitting: Emergency Medicine

## 2012-11-06 DIAGNOSIS — Z203 Contact with and (suspected) exposure to rabies: Secondary | ICD-10-CM

## 2012-11-06 MED ORDER — RABIES VACCINE, PCEC IM SUSR
INTRAMUSCULAR | Status: AC
  Filled 2012-11-06: qty 1

## 2012-11-06 MED ORDER — RABIES VACCINE, PCEC IM SUSR
1.0000 mL | Freq: Once | INTRAMUSCULAR | Status: AC
  Administered 2012-11-06: 1 mL via INTRAMUSCULAR

## 2012-11-06 NOTE — ED Notes (Signed)
Pt is here for the 3d rabies vaccination Reports being bitten in [place] and getting the first 2 rabies vaccination in [place] and needing the last 2; brought documentation to support vaccinations Denies any new medical problems; she is alert and oriented w/no signs of acute distress.

## 2012-11-08 NOTE — ED Provider Notes (Signed)
History     CSN: [id number]  Arrival date & time 11/06/12  1113   First MD Initiated Contact with Patient 11/06/12 1147      Chief Complaint  Patient presents with  . Rabies Injection    (Consider location/radiation/quality/duration/timing/severity/associated sxs/prior treatment) HPI Comments: Patient presents urgent care after having returned from [place] she was scratched by a "quoati", was started on the rabies post exposure- regimen-She has gotten to vaccinations in [place], and will like to continue with such. Patient does not know the name of the vaccine she was receiving. That she needed a total of 5 different shots.   The history is provided by the patient.    Past Medical History  Diagnosis Date  . Complication of anesthesia   . No pertinent past medical history     Past Surgical History  Procedure Laterality Date  . Tubal ligation    . Salpingoophorectomy  12/30/2011    Procedure: SALPINGO OOPHERECTOMY;  Surgeon: Annamaria Boots, MD;  Location: [facility];  Service: Gynecology;  Laterality: Left;  . Cystoscopy  12/30/2011    Procedure: CYSTOSCOPY;  Surgeon: Annamaria Boots, MD;  Location: [facility];  Service: Gynecology;  Laterality: N/A;  . Abdominal hysterectomy      No family history on file.  History  Substance Use Topics  . Smoking status: Never Smoker   . Smokeless tobacco: Not on file  . Alcohol Use: Yes     Comment: socially    OB History   Grav Para Term Preterm Abortions TAB SAB Ect Mult Living                  Review of Systems  Constitutional: Negative for fever, chills and appetite change.  Neurological: Negative for dizziness, weakness, numbness and headaches.    Allergies  Penicillins and Sulfa antibiotics  Home Medications  No current outpatient prescriptions on file.  BP 129/94  Pulse 73  Temp(Src) 98.6 F (37 C) (Oral)  Resp 18  SpO2 99%  LMP 11/17/2011  Physical Exam  Constitutional: She appears well-developed.    Musculoskeletal: She exhibits no edema and no tenderness.  Neurological: She is alert.  Skin: Rash noted. No erythema. No pallor.       ED Course  Procedures (including critical care time)  Labs Reviewed - No data to display No results found.   1. Need for post exposure prophylaxis for rabies       MDM  Continuation of post exposure rabies-  Okay to continue with rabies vaccine here- according to [facility] ([facility]), restarting the post exposure prophylaxis regimen can be offered if significant doubt is raised about effectiveness of previously use the vaccine. Patient has no concerns nor she wants to start again, the post exposure rabies regimen. I agreed      Jimmie Molly, MD 11/08/12 1950

## 2012-11-13 ENCOUNTER — Emergency Department (INDEPENDENT_AMBULATORY_CARE_PROVIDER_SITE_OTHER)
Admission: EM | Admit: 2012-11-13 | Discharge: 2012-11-13 | Disposition: A | Discharge status: Home / Self Care | Payer: BC Managed Care – PPO | Source: Home / Self Care

## 2012-11-13 ENCOUNTER — Encounter (HOSPITAL_COMMUNITY): Payer: Self-pay | Admitting: *Deleted

## 2012-11-13 DIAGNOSIS — Z203 Contact with and (suspected) exposure to rabies: Secondary | ICD-10-CM

## 2012-11-13 MED ORDER — RABIES VACCINE, PCEC IM SUSR
1.0000 mL | Freq: Once | INTRAMUSCULAR | Status: AC
  Administered 2012-11-13: 1 mL via INTRAMUSCULAR

## 2012-11-13 MED ORDER — RABIES VACCINE, PCEC IM SUSR
INTRAMUSCULAR | Status: AC
  Filled 2012-11-13: qty 1

## 2012-11-13 NOTE — ED Notes (Signed)
V.O. Onalee Hua Mabe: not necessary to provide 5th injection with healthy hx.

## 2012-11-13 NOTE — ED Notes (Signed)
Presents for rabies vaccination.  Denies any c/o's. 

## 2013-05-02 ENCOUNTER — Ambulatory Visit: Payer: Self-pay | Admitting: Obstetrics & Gynecology

## 2013-05-17 ENCOUNTER — Encounter: Payer: Self-pay | Admitting: Obstetrics & Gynecology

## 2013-05-18 ENCOUNTER — Encounter: Payer: Self-pay | Admitting: Obstetrics & Gynecology

## 2013-05-18 ENCOUNTER — Ambulatory Visit (INDEPENDENT_AMBULATORY_CARE_PROVIDER_SITE_OTHER): Payer: BC Managed Care – PPO | Admitting: Obstetrics & Gynecology

## 2013-05-18 VITALS — BP 112/70 | HR 68 | Resp 16 | Ht 65.75 in | Wt 185.8 lb

## 2013-05-18 DIAGNOSIS — Z23 Encounter for immunization: Secondary | ICD-10-CM

## 2013-05-18 DIAGNOSIS — Z0000 Encounter for general adult medical examination without abnormal findings: Secondary | ICD-10-CM

## 2013-05-18 DIAGNOSIS — Z01419 Encounter for gynecological examination (general) (routine) without abnormal findings: Secondary | ICD-10-CM

## 2013-05-18 MED ORDER — CIPROFLOXACIN HCL 500 MG PO TABS: ORAL_TABLET | ORAL | Status: DC

## 2013-05-18 NOTE — Patient Instructions (Signed)

## 2013-05-18 NOTE — Progress Notes (Signed)
42 y.o. G2P1 SingleCaucasianF here for annual exam.  Going to [place] for 2 weeks due to work.  Has been to [place] and [place] and [place].  Reviewed CDC guidelines.  Pt will call [facility] for Hep A and Typhoid vaccines.  Will give rx Ciprofloxin.  Doing well from surgical standpoint.  Did MMG.  Not in records.  Will get copy.    Patient's last menstrual period was 11/17/2011.          Sexually active: yes  The current method of family planning is status post hysterectomy.    Exercising: yes   Smoker:  no  Health Maintenance: Pap:  12/11/11 WNL/negative HR HPV History of abnormal Pap:  yes MMG:  04/21/13 [facility] [address] Colonoscopy:  none BMD:   none TDaP:  7/03 Screening Labs: non today, Hb today: 14.5, Urine today: PROTEIN-trace, KETONES-trace, RBC-trace   reports that she has never smoked. She has never used smokeless tobacco. She reports that she drinks about 1.5 ounces of alcohol per week. She reports that she does not use illicit drugs.  Past Medical History  Diagnosis Date  . Complication of anesthesia   . No pertinent past medical history   . Abnormal Pap smear     Past Surgical History  Procedure Laterality Date  . Tubal ligation    . Salpingoophorectomy  12/30/2011    Procedure: SALPINGO OOPHERECTOMY;  Surgeon: Annamaria Boots, MD;  Location: [facility];  Service: Gynecology;  Laterality: Left;  . Cystoscopy  12/30/2011    Procedure: CYSTOSCOPY;  Surgeon: Annamaria Boots, MD;  Location: [facility];  Service: Gynecology;  Laterality: N/A;  . Abdominal hysterectomy      Current Outpatient Prescriptions  Medication Sig Dispense Refill  . Multiple Vitamins-Minerals (MULTIVITAMIN PO) Take by mouth daily.       No current facility-administered medications for this visit.    Family History  Problem Relation Age of Onset  . Hypertension Mother   . Diabetes Mother   . Thyroid disease Mother   . Breast cancer Other     maternal cousin-1st , diag 40s    ROS:   Pertinent items are noted in HPI.  Otherwise, a comprehensive ROS was negative.  Exam:   BP 112/70  Pulse 68  Resp 16  Ht 5' 5.75" (1.67 m)  Wt 185 lb 12.8 oz (84.278 kg)  BMI 30.22 kg/m2  LMP 11/17/2011  Weight change: @WEIGHTCHANGE @ Height:   Height: 5' 5.75" (167 cm)  Ht Readings from Last 3 Encounters:  05/18/13 5' 5.75" (1.67 m)  12/30/11 5\' 5"  (1.651 m)  12/30/11 5\' 5"  (1.651 m)    General appearance: alert, cooperative and appears stated age Head: Normocephalic, without obvious abnormality, atraumatic Neck: no adenopathy, supple, symmetrical, trachea midline and thyroid normal to inspection and palpation Lungs: clear to auscultation bilaterally Breasts: normal appearance, no masses or tenderness Heart: regular rate and rhythm Abdomen: soft, non-tender; bowel sounds normal; no masses,  no organomegaly Extremities: extremities normal, atraumatic, no cyanosis or edema Skin: Skin color, texture, turgor normal. No rashes or lesions Lymph nodes: Cervical, supraclavicular, and axillary nodes normal. No abnormal inguinal nodes palpated Neurologic: Grossly normal   Pelvic: External genitalia:  no lesions              Urethra:  normal appearing urethra with no masses, tenderness or lesions              Bartholins and Skenes: normal  Vagina: normal appearing vagina with normal color and discharge, no lesions              Cervix: absent              Pap taken: no Bimanual Exam:  Uterus:  uterus absent              Adnexa: normal adnexa and no mass, fullness, tenderness               Rectovaginal: Confirms               Anus:  normal sphincter tone, no lesions  A:  Well Woman with normal exam S/P TLH/LSO/Right salpingectomy/cyst 6/13.  Doing well.  International traveling with work  P:   Mammogram yearly.  Will get release of records.  pap smear not indicated Tdap today Ciprofloxin 500mg  bid for 14 days for traveler's diarrhea return annually or prn  An  After Visit Summary was printed and given to the patient.

## 2014-04-24 ENCOUNTER — Encounter: Payer: Self-pay | Admitting: Obstetrics & Gynecology

## 2014-06-09 ENCOUNTER — Encounter: Payer: Self-pay | Admitting: Obstetrics & Gynecology

## 2014-06-09 ENCOUNTER — Ambulatory Visit (INDEPENDENT_AMBULATORY_CARE_PROVIDER_SITE_OTHER): Payer: BC Managed Care – PPO | Admitting: Obstetrics & Gynecology

## 2014-06-09 VITALS — BP 104/76 | HR 68 | Resp 16 | Ht 65.75 in | Wt 183.4 lb

## 2014-06-09 DIAGNOSIS — Z202 Contact with and (suspected) exposure to infections with a predominantly sexual mode of transmission: Secondary | ICD-10-CM

## 2014-06-09 DIAGNOSIS — Z0000 Encounter for general adult medical examination without abnormal findings: Secondary | ICD-10-CM

## 2014-06-09 DIAGNOSIS — Z01419 Encounter for gynecological examination (general) (routine) without abnormal findings: Secondary | ICD-10-CM

## 2014-06-09 LAB — LIPID PANEL
CHOLESTEROL: 136 mg/dL (ref 0–200)
HDL: 35 mg/dL — ABNORMAL LOW (ref >39)
LDL Cholesterol: 83 mg/dL (ref 0–99)
Total CHOL/HDL Ratio: 3.9 Ratio
Triglycerides: 90 mg/dL (ref <150)
VLDL: 18 mg/dL (ref 0–40)

## 2014-06-09 LAB — COMPREHENSIVE METABOLIC PANEL
ALK PHOS: 63 U/L (ref 39–117)
ALT: 16 U/L (ref 0–35)
AST: 23 U/L (ref 0–37)
Albumin: 4.4 g/dL (ref 3.5–5.2)
BILIRUBIN TOTAL: 0.8 mg/dL (ref 0.2–1.2)
BUN: 11 mg/dL (ref 6–23)
CO2: 23 mEq/L (ref 19–32)
Calcium: 9.1 mg/dL (ref 8.4–10.5)
Chloride: 106 mEq/L (ref 96–112)
Creat: 0.79 mg/dL (ref 0.50–1.10)
GLUCOSE: 94 mg/dL (ref 70–99)
Potassium: 4.5 mEq/L (ref 3.5–5.3)
Sodium: 141 mEq/L (ref 135–145)
Total Protein: 7.2 g/dL (ref 6.0–8.3)

## 2014-06-09 LAB — HEMOGLOBIN, FINGERSTICK: Hemoglobin, fingerstick: 13.7 g/dL (ref 12.0–16.0)

## 2014-06-09 LAB — POCT URINALYSIS DIPSTICK
Bilirubin, UA: NEGATIVE
Glucose, UA: NEGATIVE
NITRITE UA: NEGATIVE
PH UA: 5
UROBILINOGEN UA: NEGATIVE

## 2014-06-09 LAB — STD PANEL
HEP B S AG: NEGATIVE
HIV: NONREACTIVE

## 2014-06-09 MED ORDER — VALACYCLOVIR HCL 1 G PO TABS: ORAL_TABLET | ORAL | Status: DC

## 2014-06-09 MED ORDER — FLUCONAZOLE 150 MG PO TABS: 150.0000 mg | ORAL_TABLET | Freq: Once | ORAL | Status: DC

## 2014-06-09 NOTE — Progress Notes (Signed)
43 y.o. G2P1 SingleCaucasianF here for annual exam.  Doing well.  No vaginal bleeding.  Traveling a lot with work.  Exercising regularly.  New sexual partner.  Requests STD testing today.  Patient's last menstrual period was 11/17/2011.          Sexually active: Yes.    The current method of family planning is condoms and status post hysterectomy.    Exercising: Yes.     Smoker:  Former smoker  Health Maintenance: Pap:  12/11/11 WNL/negative HR HPV History of abnormal Pap:  yes MMG:  05/31/14 with [employer] mobile unit (reveiwed in [place] with [place]) Colonoscopy:  none BMD:   none TDaP:  2014 Screening Labs: today, Hb today: 13.7, Urine today: trace RBC, +1 WBCs, tr ketones, tr prot   reports that she has quit smoking. She has never used smokeless tobacco. She reports that she drinks about 1.2 - 1.8 oz of alcohol per week. She reports that she does not use illicit drugs.  Past Medical History  Diagnosis Date  . Complication of anesthesia   . No pertinent past medical history   . Abnormal Pap smear     Past Surgical History  Procedure Laterality Date  . Tubal ligation    . Salpingoophorectomy  12/30/2011    Procedure: SALPINGO OOPHERECTOMY;  Surgeon: Lyman Speller, MD;  Location: [facility];  Service: Gynecology;  Laterality: Left;  . Cystoscopy  12/30/2011    Procedure: CYSTOSCOPY;  Surgeon: Lyman Speller, MD;  Location: [facility];  Service: Gynecology;  Laterality: N/A;  . Abdominal hysterectomy      Current Outpatient Prescriptions  Medication Sig Dispense Refill  . Multiple Vitamins-Minerals (MULTIVITAMIN PO) Take by mouth daily.    . NON FORMULARY Embodylean with Biosyntric for weight loss    . NON FORMULARY Thermolean for weight loss    . NON FORMULARY Tone- with omega 3, coconut oil, flax seed oil, MCT oil     No current facility-administered medications for this visit.    Family History  Problem Relation Age of Onset  . Hypertension Mother   . Diabetes  Mother   . Thyroid disease Mother   . Breast cancer Other     maternal cousin-1st , diag 40s    ROS:  Pertinent items are noted in HPI.  Otherwise, a comprehensive ROS was negative.  Exam:   BP 104/76 mmHg  Pulse 68  Resp 16  Ht 5' 5.75" (1.67 m)  Wt 183 lb 6.4 oz (83.19 kg)  BMI 29.83 kg/m2  LMP 11/17/2011  Weight change: -2# Height: 5' 5.75" (167 cm)  Ht Readings from Last 3 Encounters:  06/09/14 5' 5.75" (1.67 m)  05/18/13 5' 5.75" (1.67 m)  12/30/11 5' 5"  (1.651 m)    General appearance: alert, cooperative and appears stated age Head: Normocephalic, without obvious abnormality, atraumatic Neck: no adenopathy, supple, symmetrical, trachea midline and thyroid normal to inspection and palpation Lungs: clear to auscultation bilaterally Breasts: normal appearance, no masses or tenderness Heart: regular rate and rhythm Abdomen: soft, non-tender; bowel sounds normal; no masses,  no organomegaly Extremities: extremities normal, atraumatic, no cyanosis or edema Skin: Skin color, texture, turgor normal. No rashes or lesions Lymph nodes: Cervical, supraclavicular, and axillary nodes normal. No abnormal inguinal nodes palpated Neurologic: Grossly normal   Pelvic: External genitalia:  no lesions              Urethra:  normal appearing urethra with no masses, tenderness or lesions  Bartholins and Skenes: normal                 Vagina: normal mucosa with white adherent discharge              Cervix: absent              Pap taken: No. Bimanual Exam:  Uterus:  uterus absent              Adnexa: normal adnexa and no mass, fullness, tenderness               Rectovaginal: Confirms               Anus:  normal sphincter tone, no lesions  Chaperone was present for exam.  A:  Well Woman with normal exam S/P TLH/LSO/Right salpingectomy/cyst 6/13. International traveling with work STD exposure HSV 1 Yeast vaginitis  P: Mammogram yearly.Can see through [facility]  Everywhere  pap smear not indicated RPR, HIV, Hep B, GC, Chl CMP, TSH, Vit D, Lipids Valtrex 1 gram--2 tabs po x 1, repeat 12 hours.  Rx to pharmacy Diflucan 150 mg po x 1 repeat 48 hrs.  #2/1RF return annually or prn

## 2014-06-10 LAB — TSH: TSH: 1.184 u[IU]/mL (ref 0.350–4.500)

## 2014-06-10 LAB — VITAMIN D 25 HYDROXY (VIT D DEFICIENCY, FRACTURES): VIT D 25 HYDROXY: 28 ng/mL — AB (ref 30–100)

## 2014-06-12 NOTE — Addendum Note (Signed)
Addended by: Megan Salon on: 06/12/2014 01:05 PM   Modules accepted: Orders, SmartSet

## 2014-06-14 LAB — IPS N GONORRHOEA AND CHLAMYDIA BY PCR

## 2014-06-19 ENCOUNTER — Telehealth: Payer: Self-pay

## 2014-06-19 NOTE — Telephone Encounter (Signed)
Lmtcb//kn

## 2014-06-19 NOTE — Telephone Encounter (Signed)
-----   Message from Lyman Speller, MD sent at 06/12/2014  1:06 PM EST ----- Inform cholesterol is fine.  TSH normal.  Vit D is low.  Needs to be on 2000 IU daily.  Repeat next year.  CMP nl.  HIV, RPR, hep B negative. Gc/Chl pending.  Can hold for these results.

## 2014-06-19 NOTE — Telephone Encounter (Signed)
Patient notified of all results.//kn

## 2015-07-17 ENCOUNTER — Encounter: Payer: Self-pay | Admitting: Obstetrics & Gynecology

## 2015-07-17 ENCOUNTER — Ambulatory Visit (INDEPENDENT_AMBULATORY_CARE_PROVIDER_SITE_OTHER): Payer: BLUE CROSS/BLUE SHIELD | Admitting: Obstetrics & Gynecology

## 2015-07-17 VITALS — BP 108/68 | HR 76 | Resp 16 | Ht 65.5 in | Wt 193.0 lb

## 2015-07-17 DIAGNOSIS — Z202 Contact with and (suspected) exposure to infections with a predominantly sexual mode of transmission: Secondary | ICD-10-CM | POA: Diagnosis not present

## 2015-07-17 DIAGNOSIS — Z0000 Encounter for general adult medical examination without abnormal findings: Secondary | ICD-10-CM | POA: Diagnosis not present

## 2015-07-17 DIAGNOSIS — E559 Vitamin D deficiency, unspecified: Secondary | ICD-10-CM | POA: Diagnosis not present

## 2015-07-17 DIAGNOSIS — Z01419 Encounter for gynecological examination (general) (routine) without abnormal findings: Secondary | ICD-10-CM

## 2015-07-17 LAB — COMPREHENSIVE METABOLIC PANEL
ALT: 13 U/L (ref 6–29)
AST: 14 U/L (ref 10–30)
Albumin: 4.3 g/dL (ref 3.6–5.1)
Alkaline Phosphatase: 57 U/L (ref 33–115)
BUN: 14 mg/dL (ref 7–25)
CO2: 25 mmol/L (ref 20–31)
Calcium: 9 mg/dL (ref 8.6–10.2)
Chloride: 104 mmol/L (ref 98–110)
Creat: 0.82 mg/dL (ref 0.50–1.10)
Glucose, Bld: 78 mg/dL (ref 65–99)
Potassium: 4.2 mmol/L (ref 3.5–5.3)
Sodium: 139 mmol/L (ref 135–146)
Total Bilirubin: 0.4 mg/dL (ref 0.2–1.2)
Total Protein: 6.9 g/dL (ref 6.1–8.1)

## 2015-07-17 LAB — LIPID PANEL
Cholesterol: 141 mg/dL (ref 125–200)
HDL: 33 mg/dL — ABNORMAL LOW (ref >=46)
LDL Cholesterol: 60 mg/dL (ref <130)
Total CHOL/HDL Ratio: 4.3 Ratio (ref <=5.0)
Triglycerides: 240 mg/dL — ABNORMAL HIGH (ref <150)
VLDL: 48 mg/dL — ABNORMAL HIGH (ref <30)

## 2015-07-17 LAB — POCT URINALYSIS DIPSTICK
Bilirubin, UA: NEGATIVE
Blood, UA: NEGATIVE
Glucose, UA: NEGATIVE
Ketones, UA: NEGATIVE
Leukocytes, UA: NEGATIVE
Nitrite, UA: NEGATIVE
Protein, UA: NEGATIVE
Urobilinogen, UA: NEGATIVE
pH, UA: 5

## 2015-07-17 LAB — TSH: TSH: 1.393 u[IU]/mL (ref 0.350–4.500)

## 2015-07-17 LAB — HEMOGLOBIN, FINGERSTICK: Hemoglobin, fingerstick: 12.7 g/dL (ref 12.0–16.0)

## 2015-07-17 MED ORDER — VALACYCLOVIR HCL 1 G PO TABS: ORAL_TABLET | ORAL | Status: DC

## 2015-07-17 NOTE — Progress Notes (Signed)
45 y.o. G2P1 SingleCaucasianF here for annual exam.  Doing well.  Going to [place] for a month in February.  No vaginal bleeding.  Out of relationship right now.  Patient's last menstrual period was 11/17/2011.          Sexually active: Yes.    The current method of family planning is Hysterectomy, Condoms.    Exercising: Yes.    Weights, classes, walk  Smoker:  no  Health Maintenance: Pap:  12/11/11 Neg. HR HPV:neg History of abnormal Pap:  yes MMG:  05/31/14 BIRADS2:Benign  Colonoscopy:  Never BMD:   Never TDaP:  2014 Screening Labs: blood drawn, Hb today: 12.7, Urine today: negative   reports that she has quit smoking. She has never used smokeless tobacco. She reports that she drinks about 1.2 - 1.8 oz of alcohol per week. She reports that she does not use illicit drugs.  Past Medical History  Diagnosis Date  . Complication of anesthesia   . No pertinent past medical history   . Abnormal Pap smear     Past Surgical History  Procedure Laterality Date  . Tubal ligation    . Salpingoophorectomy  12/30/2011    Procedure: SALPINGO OOPHERECTOMY;  Surgeon: Lyman Speller, MD;  Location: [facility];  Service: Gynecology;  Laterality: Left;  . Cystoscopy  12/30/2011    Procedure: CYSTOSCOPY;  Surgeon: Lyman Speller, MD;  Location: [facility];  Service: Gynecology;  Laterality: N/A;  . Abdominal hysterectomy      Current Outpatient Prescriptions  Medication Sig Dispense Refill  . atovaquone-proguanil (MALARONE) 250-100 MG TABS tablet     . Multiple Vitamins-Minerals (MULTIVITAMIN PO) Take by mouth daily.    . valACYclovir (VALTREX) 1000 MG tablet Take as directed 30 tablet 2   No current facility-administered medications for this visit.    Family History  Problem Relation Age of Onset  . Hypertension Mother   . Diabetes Mother   . Thyroid disease Mother   . Breast cancer Other     maternal cousin-1st , diag 40s    ROS:  Pertinent items are noted in HPI.  Otherwise, a  comprehensive ROS was negative.  Exam:   BP 108/68 mmHg  Pulse 76  Resp 16  Ht 5' 5.5" (1.664 m)  Wt 193 lb (87.544 kg)  BMI 31.62 kg/m2  LMP 11/17/2011  Weight change: +10#  Height: 5' 5.5" (166.4 cm)  Ht Readings from Last 3 Encounters:  07/17/15 5' 5.5" (1.664 m)  06/09/14 5' 5.75" (1.67 m)  05/18/13 5' 5.75" (1.67 m)    General appearance: alert, cooperative and appears stated age Head: Normocephalic, without obvious abnormality, atraumatic Neck: no adenopathy, supple, symmetrical, trachea midline and thyroid normal to inspection and palpation Lungs: clear to auscultation bilaterally Breasts: normal appearance, no masses or tenderness Heart: regular rate and rhythm Abdomen: soft, non-tender; bowel sounds normal; no masses,  no organomegaly Extremities: extremities normal, atraumatic, no cyanosis or edema Skin: Skin color, texture, turgor normal. No rashes or lesions Lymph nodes: Cervical, supraclavicular, and axillary nodes normal. No abnormal inguinal nodes palpated Neurologic: Grossly normal   Pelvic: External genitalia:  no lesions              Urethra:  normal appearing urethra with no masses, tenderness or lesions              Bartholins and Skenes: normal                 Vagina: normal appearing vagina  with normal color and discharge, no lesions              Cervix: absent              Pap taken: No. Bimanual Exam:  Uterus:  uterus absent              Adnexa: normal adnexa and no mass, fullness, tenderness               Rectovaginal: Confirms               Anus:  normal sphincter tone, no lesions  Chaperone was present for exam.  A:  Well Woman with normal exam S/P TLH/LSO/Right salpingectomy/cyst 6/13. International traveling with work STD exposure HSV 1  P: Mammogram yearly. Pt aware this is overdue. Pap smear not indicated RPR, HIV, GC, Chl obtained today CMP, TSH, Vit D, Lipids Valtrex 1 gram--2 tabs po x 1, repeat 12 hours. #30/2RF.  Rx to  pharmacy return annually or prn

## 2015-07-17 NOTE — Patient Instructions (Signed)
Valtrex.  2gram (2 tabs) at symptom onset and repeat in 12 hours (2 grams = 2 tabs)

## 2015-07-18 LAB — VITAMIN D 25 HYDROXY (VIT D DEFICIENCY, FRACTURES): VIT D 25 HYDROXY: 25 ng/mL — AB (ref 30–100)

## 2015-07-18 LAB — RPR

## 2015-07-18 LAB — HIV ANTIBODY (ROUTINE TESTING W REFLEX): HIV 1&2 Ab, 4th Generation: NONREACTIVE

## 2015-07-19 MED ORDER — VITAMIN D (ERGOCALCIFEROL) 1.25 MG (50000 UNIT) PO CAPS: 50000.0000 [IU] | ORAL_CAPSULE | ORAL | Status: DC

## 2015-07-19 NOTE — Addendum Note (Signed)
Addended by: Megan Salon on: 07/19/2015 07:18 AM   Modules accepted: Orders, SmartSet

## 2015-07-20 LAB — IPS N GONORRHOEA AND CHLAMYDIA BY PCR

## 2015-10-02 DIAGNOSIS — E559 Vitamin D deficiency, unspecified: Secondary | ICD-10-CM | POA: Insufficient documentation

## 2015-10-02 DIAGNOSIS — Z8619 Personal history of other infectious and parasitic diseases: Secondary | ICD-10-CM | POA: Insufficient documentation

## 2015-11-22 ENCOUNTER — Telehealth: Payer: Self-pay | Admitting: *Deleted

## 2015-11-22 NOTE — Telephone Encounter (Signed)
-----   Message from Megan Salon, MD sent at 11/21/2015  5:19 AM EDT ----- Regarding: vit d recheck Can you please call this pt and remind her about coming back for her Vit D recheck?  No appt is scheduled at this time.  Thanks.  MSM

## 2015-11-22 NOTE — Telephone Encounter (Signed)
Per Dr. Sabra Heck, called patient to schedule an appointment to have vitamin d rechecked. Lab appointment scheduled for Tuesday June 6th at 1400. Patient verbalized understanding.

## 2015-11-27 ENCOUNTER — Other Ambulatory Visit (INDEPENDENT_AMBULATORY_CARE_PROVIDER_SITE_OTHER): Payer: BLUE CROSS/BLUE SHIELD

## 2015-11-27 DIAGNOSIS — E559 Vitamin D deficiency, unspecified: Secondary | ICD-10-CM

## 2015-11-28 LAB — VITAMIN D 25 HYDROXY (VIT D DEFICIENCY, FRACTURES): VIT D 25 HYDROXY: 31 ng/mL (ref 30–100)

## 2015-12-05 ENCOUNTER — Other Ambulatory Visit: Payer: Self-pay | Admitting: Obstetrics & Gynecology

## 2015-12-05 DIAGNOSIS — E559 Vitamin D deficiency, unspecified: Secondary | ICD-10-CM

## 2015-12-05 MED ORDER — VITAMIN D (ERGOCALCIFEROL) 1.25 MG (50000 UNIT) PO CAPS: 50000.0000 [IU] | ORAL_CAPSULE | ORAL | Status: DC

## 2016-08-05 ENCOUNTER — Ambulatory Visit (INDEPENDENT_AMBULATORY_CARE_PROVIDER_SITE_OTHER): Payer: BLUE CROSS/BLUE SHIELD | Admitting: Obstetrics & Gynecology

## 2016-08-05 ENCOUNTER — Encounter: Payer: Self-pay | Admitting: Obstetrics & Gynecology

## 2016-08-05 VITALS — BP 116/74 | HR 66 | Resp 14 | Ht 65.5 in | Wt 201.0 lb

## 2016-08-05 DIAGNOSIS — Z0000 Encounter for general adult medical examination without abnormal findings: Secondary | ICD-10-CM | POA: Diagnosis not present

## 2016-08-05 DIAGNOSIS — Z01419 Encounter for gynecological examination (general) (routine) without abnormal findings: Secondary | ICD-10-CM | POA: Diagnosis not present

## 2016-08-05 DIAGNOSIS — E781 Pure hyperglyceridemia: Secondary | ICD-10-CM

## 2016-08-05 DIAGNOSIS — R7309 Other abnormal glucose: Secondary | ICD-10-CM

## 2016-08-05 LAB — COMPREHENSIVE METABOLIC PANEL
ALK PHOS: 55 U/L (ref 33–115)
ALT: 15 U/L (ref 6–29)
AST: 17 U/L (ref 10–30)
Albumin: 4.5 g/dL (ref 3.6–5.1)
BILIRUBIN TOTAL: 0.4 mg/dL (ref 0.2–1.2)
BUN: 13 mg/dL (ref 7–25)
CALCIUM: 9.1 mg/dL (ref 8.6–10.2)
CO2: 26 mmol/L (ref 20–31)
Chloride: 102 mmol/L (ref 98–110)
Creat: 0.83 mg/dL (ref 0.50–1.10)
GLUCOSE: 106 mg/dL — AB (ref 65–99)
Potassium: 4 mmol/L (ref 3.5–5.3)
Sodium: 137 mmol/L (ref 135–146)
Total Protein: 7.1 g/dL (ref 6.1–8.1)

## 2016-08-05 LAB — LIPID PANEL
CHOL/HDL RATIO: 5.7 ratio — AB (ref <5.0)
Cholesterol: 136 mg/dL (ref <200)
HDL: 24 mg/dL — AB (ref >50)
LDL CALC: 65 mg/dL (ref <100)
TRIGLYCERIDES: 236 mg/dL — AB (ref <150)
VLDL: 47 mg/dL — ABNORMAL HIGH (ref <30)

## 2016-08-05 LAB — CBC
HEMATOCRIT: 39 % (ref 35.0–45.0)
HEMOGLOBIN: 13.2 g/dL (ref 11.7–15.5)
MCH: 30.8 pg (ref 27.0–33.0)
MCHC: 33.8 g/dL (ref 32.0–36.0)
MCV: 91.1 fL (ref 80.0–100.0)
MPV: 9.7 fL (ref 7.5–12.5)
Platelets: 292 10*3/uL (ref 140–400)
RBC: 4.28 MIL/uL (ref 3.80–5.10)
RDW: 13.2 % (ref 11.0–15.0)
WBC: 8.3 10*3/uL (ref 3.8–10.8)

## 2016-08-05 LAB — POCT URINALYSIS DIPSTICK
Bilirubin, UA: NEGATIVE
GLUCOSE UA: NEGATIVE
KETONES UA: NEGATIVE
Leukocytes, UA: NEGATIVE
Nitrite, UA: NEGATIVE
Protein, UA: NEGATIVE
RBC UA: NEGATIVE
Urobilinogen, UA: NEGATIVE
pH, UA: 5

## 2016-08-05 LAB — TSH: TSH: 1.22 m[IU]/L

## 2016-08-05 MED ORDER — VALACYCLOVIR HCL 1 G PO TABS: ORAL_TABLET | ORAL | 2 refills | Status: DC

## 2016-08-05 NOTE — Progress Notes (Signed)
46 y.o. G2P1 Single Caucasian F here for annual exam.  Doing well.  Still traveling a lot for work.    Patient's last menstrual period was 11/17/2011.          Sexually active: Yes.    The current method of family planning is status post hysterectomy.    Exercising: Yes.    boot camps Smoker:  Former smoker  Health Maintenance: Pap:  2013 negative, HR HPV negative  History of abnormal Pap:  yes MMG:  08/28/15 BIRADS 2 benign  Colonoscopy:  never BMD:   Never TDaP:  05/18/13  Pneumonia vaccine(s):  never Zostavax:   never Hep C testing: not indicated  Screening Labs: drawn today, Hb today: same, Urine today: normal     reports that she has quit smoking. She has never used smokeless tobacco. She reports that she drinks about 1.2 - 1.8 oz of alcohol per week . She reports that she does not use drugs.  Past Medical History:  Diagnosis Date  . Abnormal Pap smear   . Complication of anesthesia   . No pertinent past medical history     Past Surgical History:  Procedure Laterality Date  . ABDOMINAL HYSTERECTOMY    . CYSTOSCOPY  12/30/2011   Procedure: CYSTOSCOPY;  Surgeon: Lyman Speller, MD;  Location: [facility];  Service: Gynecology;  Laterality: N/A;  . SALPINGOOPHORECTOMY  12/30/2011   Procedure: SALPINGO OOPHERECTOMY;  Surgeon: Lyman Speller, MD;  Location: [facility];  Service: Gynecology;  Laterality: Left;  . TUBAL LIGATION      Current Outpatient Prescriptions  Medication Sig Dispense Refill  . Cholecalciferol (VITAMIN D PO) Take by mouth.    . Multiple Vitamins-Minerals (MULTIVITAMIN PO) Take by mouth daily.    . valACYclovir (VALTREX) 1000 MG tablet Take as directed 30 tablet 2   No current facility-administered medications for this visit.     Family History  Problem Relation Age of Onset  . Hypertension Mother   . Diabetes Mother   . Thyroid disease Mother   . Breast cancer Other     maternal cousin-1st , diag 40s    ROS:  Pertinent items are noted in HPI.   Otherwise, a comprehensive ROS was negative.  Exam:   BP 116/74 (BP Location: Right Arm, Patient Position: Sitting, Cuff Size: Normal)   Pulse 66   Resp 14   Ht 5' 5.5" (1.664 m)   Wt 201 lb (91.2 kg)   LMP 11/17/2011   BMI 32.94 kg/m   Height: 5' 5.5" (166.4 cm)  Ht Readings from Last 3 Encounters:  08/05/16 5' 5.5" (1.664 m)  07/17/15 5' 5.5" (1.664 m)  06/09/14 5' 5.75" (1.67 m)    General appearance: alert, cooperative and appears stated age Head: Normocephalic, without obvious abnormality, atraumatic Neck: no adenopathy, supple, symmetrical, trachea midline and thyroid normal to inspection and palpation Lungs: clear to auscultation bilaterally Breasts: normal appearance, no masses or tenderness Heart: regular rate and rhythm Abdomen: soft, non-tender; bowel sounds normal; no masses,  no organomegaly Extremities: extremities normal, atraumatic, no cyanosis or edema Skin: Skin color, texture, turgor normal. No rashes or lesions Lymph nodes: Cervical, supraclavicular, and axillary nodes normal. No abnormal inguinal nodes palpated Neurologic: Grossly normal  Pelvic: External genitalia:  no lesions              Urethra:  normal appearing urethra with no masses, tenderness or lesions              Bartholins  and Skenes: normal                 Vagina: normal appearing vagina with normal color and discharge, no lesions              Cervix: absent              Pap taken: No. Bimanual Exam:  Uterus:  normal size, contour, position, consistency, mobility, non-tender              Adnexa: normal adnexa and no mass, fullness, tenderness               Rectovaginal: Confirms               Anus:  normal sphincter tone, no lesions  Chaperone was present for exam.  A:  Well woman with normal exam S/P TLH/LSO/right salpingectomy 6/13 International travel with work H/O HSV 1  P:   Mammogram guidelines reviewed.  Recommended 3D mammogram due to breast density pap smear not  indicated Valtrex 1gm, takes with fever blisters.  #0/2RF. TSH, Lipids, CMP, CBC return annually or prn

## 2016-08-06 ENCOUNTER — Telehealth: Payer: Self-pay

## 2016-08-06 NOTE — Addendum Note (Signed)
Addended by: Megan Salon on: 08/06/2016 07:23 AM   Modules accepted: Orders

## 2016-08-06 NOTE — Telephone Encounter (Signed)
-----   Message from Megan Salon, MD sent at 08/06/2016  7:21 AM EST ----- Please inform pt her TSH was normal.  CMP showed mildly elevated glucose.  Her Lipid panel was abnormal with elevated triglycerides of 236.  Her HDLs are quite low at 24.  I'd like to repeat this fasting and also check a HbA1C to check for diabetes.  Orders placed for these.  Please schedule follow up lab appt.  Thanks.

## 2016-08-06 NOTE — Telephone Encounter (Signed)
Advised patient of results. Patient scheduled lab appointment for 08/07/16. Patient verbalized understanding and agreement.

## 2016-08-06 NOTE — Telephone Encounter (Signed)
Left message for patient to call Summer back.

## 2016-08-07 ENCOUNTER — Other Ambulatory Visit (INDEPENDENT_AMBULATORY_CARE_PROVIDER_SITE_OTHER): Payer: BLUE CROSS/BLUE SHIELD

## 2016-08-07 DIAGNOSIS — E781 Pure hyperglyceridemia: Secondary | ICD-10-CM | POA: Diagnosis not present

## 2016-08-07 DIAGNOSIS — R7309 Other abnormal glucose: Secondary | ICD-10-CM

## 2016-08-07 LAB — LIPID PANEL
Cholesterol: 127 mg/dL (ref <200)
HDL: 24 mg/dL — AB (ref >50)
LDL Cholesterol: 62 mg/dL (ref <100)
TRIGLYCERIDES: 206 mg/dL — AB (ref <150)
Total CHOL/HDL Ratio: 5.3 Ratio — ABNORMAL HIGH (ref <5.0)
VLDL: 41 mg/dL — ABNORMAL HIGH (ref <30)

## 2016-08-08 LAB — HEMOGLOBIN A1C
Hgb A1c MFr Bld: 5 % (ref <5.7)
MEAN PLASMA GLUCOSE: 97 mg/dL

## 2016-08-11 ENCOUNTER — Telehealth: Payer: Self-pay | Admitting: *Deleted

## 2016-08-11 NOTE — Telephone Encounter (Signed)
Patient returned call. Results given as seen below from Dr. Sabra Heck and patient verbalized understanding. Patient states that her HDL's are always low, but her ratio is usually normal. Patient states she has a goal of losing 25 pounds by May and will consider repeating levels then. RN advised this message would be sent to Dr. Sabra Heck for review and our office would call with any additional recommendations.   Routing to provider for review.

## 2016-08-11 NOTE — Telephone Encounter (Signed)
Message left to return call to [facility] at [phone].

## 2016-08-11 NOTE — Telephone Encounter (Signed)
-----   Message from Megan Salon, MD sent at 08/09/2016  6:17 PM EST ----- Please let pt know her Hb A1C was normal at 5.0.  Her triglycerides were better at 206 vs 236.  Her HDLs are still low and her cholesterol ratio is increased suggesting an increase in risk for cardiovascular disease.  I think she should consider working on about 20-25 pounds of weight loss this next year and repeat the levels then OR seeing PCP to discuss if treatment is indicated.  We can refer her if she needs.  Thanks.

## 2016-09-30 DIAGNOSIS — B354 Tinea corporis: Secondary | ICD-10-CM | POA: Diagnosis not present

## 2016-12-23 DIAGNOSIS — L821 Other seborrheic keratosis: Secondary | ICD-10-CM | POA: Diagnosis not present

## 2016-12-23 DIAGNOSIS — B360 Pityriasis versicolor: Secondary | ICD-10-CM | POA: Diagnosis not present

## 2016-12-23 DIAGNOSIS — D229 Melanocytic nevi, unspecified: Secondary | ICD-10-CM | POA: Diagnosis not present

## 2017-04-23 DIAGNOSIS — W540XXA Bitten by dog, initial encounter: Secondary | ICD-10-CM

## 2017-04-23 HISTORY — DX: Bitten by dog, initial encounter: W54.0XXA

## 2017-05-04 DIAGNOSIS — S61411A Laceration without foreign body of right hand, initial encounter: Secondary | ICD-10-CM | POA: Diagnosis not present

## 2017-05-04 DIAGNOSIS — Z880 Allergy status to penicillin: Secondary | ICD-10-CM | POA: Diagnosis not present

## 2017-05-04 DIAGNOSIS — M7989 Other specified soft tissue disorders: Secondary | ICD-10-CM | POA: Diagnosis not present

## 2017-05-04 DIAGNOSIS — Y999 Unspecified external cause status: Secondary | ICD-10-CM | POA: Diagnosis not present

## 2017-05-04 DIAGNOSIS — W540XXA Bitten by dog, initial encounter: Secondary | ICD-10-CM | POA: Diagnosis not present

## 2017-05-04 DIAGNOSIS — S61412A Laceration without foreign body of left hand, initial encounter: Secondary | ICD-10-CM | POA: Diagnosis not present

## 2017-05-04 DIAGNOSIS — S40879A Other superficial bite of unspecified upper arm, initial encounter: Secondary | ICD-10-CM | POA: Diagnosis not present

## 2017-05-04 DIAGNOSIS — Z882 Allergy status to sulfonamides status: Secondary | ICD-10-CM | POA: Diagnosis not present

## 2017-05-04 DIAGNOSIS — S51852A Open bite of left forearm, initial encounter: Secondary | ICD-10-CM | POA: Diagnosis not present

## 2017-05-06 DIAGNOSIS — S61411A Laceration without foreign body of right hand, initial encounter: Secondary | ICD-10-CM | POA: Diagnosis not present

## 2017-05-06 DIAGNOSIS — W540XXA Bitten by dog, initial encounter: Secondary | ICD-10-CM | POA: Diagnosis not present

## 2017-05-18 DIAGNOSIS — S61411A Laceration without foreign body of right hand, initial encounter: Secondary | ICD-10-CM | POA: Diagnosis not present

## 2017-05-18 DIAGNOSIS — W540XXD Bitten by dog, subsequent encounter: Secondary | ICD-10-CM | POA: Diagnosis not present

## 2017-05-25 DIAGNOSIS — H18211 Corneal edema secondary to contact lens, right eye: Secondary | ICD-10-CM | POA: Diagnosis not present

## 2017-05-25 DIAGNOSIS — H1031 Unspecified acute conjunctivitis, right eye: Secondary | ICD-10-CM | POA: Diagnosis not present

## 2017-11-03 ENCOUNTER — Ambulatory Visit (INDEPENDENT_AMBULATORY_CARE_PROVIDER_SITE_OTHER): Payer: BLUE CROSS/BLUE SHIELD | Admitting: Obstetrics & Gynecology

## 2017-11-03 ENCOUNTER — Encounter: Payer: Self-pay | Admitting: Obstetrics & Gynecology

## 2017-11-03 ENCOUNTER — Encounter

## 2017-11-03 VITALS — BP 110/78 | HR 84 | Resp 16 | Ht 65.5 in | Wt 190.4 lb

## 2017-11-03 DIAGNOSIS — Z113 Encounter for screening for infections with a predominantly sexual mode of transmission: Secondary | ICD-10-CM | POA: Diagnosis not present

## 2017-11-03 DIAGNOSIS — Z01419 Encounter for gynecological examination (general) (routine) without abnormal findings: Secondary | ICD-10-CM | POA: Diagnosis not present

## 2017-11-03 DIAGNOSIS — R509 Fever, unspecified: Secondary | ICD-10-CM | POA: Diagnosis not present

## 2017-11-03 DIAGNOSIS — N3090 Cystitis, unspecified without hematuria: Secondary | ICD-10-CM

## 2017-11-03 DIAGNOSIS — R300 Dysuria: Secondary | ICD-10-CM | POA: Diagnosis not present

## 2017-11-03 LAB — POCT URINALYSIS DIPSTICK
Bilirubin, UA: NEGATIVE
GLUCOSE UA: NEGATIVE
KETONES UA: NEGATIVE
Nitrite, UA: NEGATIVE
Protein, UA: NEGATIVE
Urobilinogen, UA: 0.2 E.U./dL
pH, UA: 6 (ref 5.0–8.0)

## 2017-11-03 MED ORDER — NITROFURANTOIN MONOHYD MACRO 100 MG PO CAPS: 100.0000 mg | ORAL_CAPSULE | Freq: Two times a day (BID) | ORAL | 0 refills | Status: DC

## 2017-11-03 MED ORDER — PHENAZOPYRIDINE HCL 100 MG PO TABS: 100.0000 mg | ORAL_TABLET | Freq: Three times a day (TID) | ORAL | 0 refills | Status: DC | PRN

## 2017-11-03 MED ORDER — CIPROFLOXACIN HCL 500 MG PO TABS: 500.0000 mg | ORAL_TABLET | Freq: Two times a day (BID) | ORAL | 0 refills | Status: DC

## 2017-11-03 NOTE — Progress Notes (Signed)
47 y.o. G2P1 SingleCaucasianF here for annual exam.  Started having urinary pain last night.  Reports she just didn't feel "right" yesterday.  Woke up sweating in the middle of the night.  Ran fever ~100.    Denies vaginal bleeding and vaginal discharge.  Desires STD today.  Significant other of 3 years has other relationship.    Had dog bite 05/04/17.  Had some tendon issues on the right hand.  Has some scarring on right hand.    Patient's last menstrual period was 11/17/2011.          Sexually active: Yes.    The current method of family planning is status post hysterectomy.    Exercising: Yes.    walking, weights Smoker:  Former smoker   Health Maintenance: Pap:  12/11/11 negative, HR HPV negative  History of abnormal Pap:  yes MMG:  08/28/15 BIRADS2:benign  Colonoscopy:  Never BMD:   Never TDaP:  05/18/13  Screening Labs: here today  UA: WBC=small, RBC=mod   reports that she has quit smoking. She has never used smokeless tobacco. She reports that she drinks about 1.2 - 1.8 oz of alcohol per week. She reports that she does not use drugs.  Past Medical History:  Diagnosis Date  . Abnormal Pap smear   . Complication of anesthesia   . Dog bite 04/2017    Past Surgical History:  Procedure Laterality Date  . ABDOMINAL HYSTERECTOMY    . CYSTOSCOPY  12/30/2011   Procedure: CYSTOSCOPY;  Surgeon: Lyman Speller, MD;  Location: [facility];  Service: Gynecology;  Laterality: N/A;  . SALPINGOOPHORECTOMY  12/30/2011   Procedure: SALPINGO OOPHERECTOMY;  Surgeon: Lyman Speller, MD;  Location: [facility];  Service: Gynecology;  Laterality: Left;  . TUBAL LIGATION      Current Outpatient Medications  Medication Sig Dispense Refill  . Multiple Vitamins-Minerals (MULTIVITAMIN PO) Take by mouth daily.    . valACYclovir (VALTREX) 1000 MG tablet Take as directed 30 tablet 2  . nitrofurantoin, macrocrystal-monohydrate, (MACROBID) 100 MG capsule Take 1 capsule (100 mg total) by mouth 2 (two) times  daily. 10 capsule 0  . phenazopyridine (PYRIDIUM) 100 MG tablet Take 1 tablet (100 mg total) by mouth 3 (three) times daily as needed for pain. 15 tablet 0   No current facility-administered medications for this visit.     Family History  Problem Relation Age of Onset  . Hypertension Mother   . Diabetes Mother   . Thyroid disease Mother   . Breast cancer Other        maternal cousin-1st , diag 40s    Review of Systems  Constitutional: Positive for chills and fever.  HENT: Positive for congestion.   Genitourinary: Positive for dysuria, frequency and urgency.       Blood in urine Loss of urine with sneeze or cough   All other systems reviewed and are negative.   Exam:   BP 110/78 (BP Location: Right Arm, Patient Position: Sitting, Cuff Size: Large)   Pulse 84   Resp 16   Ht 5' 5.5" (1.664 m)   Wt 190 lb 6.4 oz (86.4 kg)   LMP 11/17/2011   BMI 31.20 kg/m    Height: 5' 5.5" (166.4 cm)  Ht Readings from Last 3 Encounters:  11/03/17 5' 5.5" (1.664 m)  08/05/16 5' 5.5" (1.664 m)  07/17/15 5' 5.5" (1.664 m)    General appearance: alert, cooperative and appears stated age Head: Normocephalic, without obvious abnormality, atraumatic Neck: no adenopathy,  supple, symmetrical, trachea midline and thyroid normal to inspection and palpation Lungs: clear to auscultation bilaterally Breasts: normal appearance, no masses or tenderness Heart: regular rate and rhythm Abdomen: soft, non-tender; bowel sounds normal; no masses,  no organomegaly Extremities: extremities normal, atraumatic, no cyanosis or edema Skin: Skin color, texture, turgor normal. No rashes or lesions Lymph nodes: Cervical, supraclavicular, and axillary nodes normal. No abnormal inguinal nodes palpated Neurologic: Grossly normal   Pelvic: External genitalia:  no lesions              Urethra:  normal appearing urethra with no masses, tenderness or lesions              Bartholins and Skenes: normal                  Vagina: normal appearing vagina with normal color and discharge, no lesions              Cervix: absent              Pap taken: No. Bimanual Exam:  Uterus:  uterus absent              Adnexa: normal adnexa and no mass, fullness, tenderness               Rectovaginal: Confirms               Anus:  normal sphincter tone, no lesions  Chaperone was present for exam.  A:  Well Woman with normal exam Complicated UTI (due to fever) S/p TLH/LSO/right salpingectomy 6/13 H/O HSV 1 Desires STD testing today  P:   Mammogram guidelines reviewed.  Pt aware overdue.  She will schedule. pap smear not indicated Ciprofloxin 542m bid x 5 days Urine culture pending Pyridium 108mtid prn.  #15/0RF HIV, RPR, Hep B SAg, GC/Chl obtained today CBC with diff obtained today as well Return annually or prn

## 2017-11-04 LAB — CBC WITH DIFFERENTIAL/PLATELET
BASOS: 1 %
Basophils Absolute: 0.1 10*3/uL (ref 0.0–0.2)
EOS (ABSOLUTE): 0.2 10*3/uL (ref 0.0–0.4)
EOS: 2 %
HEMATOCRIT: 40.6 % (ref 34.0–46.6)
Hemoglobin: 13.8 g/dL (ref 11.1–15.9)
IMMATURE GRANS (ABS): 0 10*3/uL (ref 0.0–0.1)
IMMATURE GRANULOCYTES: 0 %
LYMPHS: 20 %
Lymphocytes Absolute: 1.3 10*3/uL (ref 0.7–3.1)
MCH: 30.7 pg (ref 26.6–33.0)
MCHC: 34 g/dL (ref 31.5–35.7)
MCV: 90 fL (ref 79–97)
MONOCYTES: 10 %
Monocytes Absolute: 0.7 10*3/uL (ref 0.1–0.9)
NEUTROS PCT: 67 %
Neutrophils Absolute: 4.6 10*3/uL (ref 1.4–7.0)
Platelets: 287 10*3/uL (ref 150–379)
RBC: 4.49 x10E6/uL (ref 3.77–5.28)
RDW: 13.3 % (ref 12.3–15.4)
WBC: 6.8 10*3/uL (ref 3.4–10.8)

## 2017-11-04 LAB — HEP, RPR, HIV PANEL
HIV SCREEN 4TH GENERATION: NONREACTIVE
Hepatitis B Surface Ag: NEGATIVE
RPR: NONREACTIVE

## 2017-11-04 LAB — URINALYSIS, MICROSCOPIC ONLY: CASTS: NONE SEEN /LPF

## 2017-11-04 LAB — GC/CHLAMYDIA PROBE AMP
CHLAMYDIA, DNA PROBE: NEGATIVE
Neisseria gonorrhoeae by PCR: NEGATIVE

## 2017-11-05 LAB — URINE CULTURE

## 2018-04-27 DIAGNOSIS — M9905 Segmental and somatic dysfunction of pelvic region: Secondary | ICD-10-CM | POA: Diagnosis not present

## 2018-04-27 DIAGNOSIS — M5127 Other intervertebral disc displacement, lumbosacral region: Secondary | ICD-10-CM | POA: Diagnosis not present

## 2018-04-27 DIAGNOSIS — M9903 Segmental and somatic dysfunction of lumbar region: Secondary | ICD-10-CM | POA: Diagnosis not present

## 2018-04-27 DIAGNOSIS — M9904 Segmental and somatic dysfunction of sacral region: Secondary | ICD-10-CM | POA: Diagnosis not present

## 2018-04-30 DIAGNOSIS — M9905 Segmental and somatic dysfunction of pelvic region: Secondary | ICD-10-CM | POA: Diagnosis not present

## 2018-04-30 DIAGNOSIS — M9903 Segmental and somatic dysfunction of lumbar region: Secondary | ICD-10-CM | POA: Diagnosis not present

## 2018-04-30 DIAGNOSIS — M9904 Segmental and somatic dysfunction of sacral region: Secondary | ICD-10-CM | POA: Diagnosis not present

## 2018-04-30 DIAGNOSIS — M5127 Other intervertebral disc displacement, lumbosacral region: Secondary | ICD-10-CM | POA: Diagnosis not present

## 2018-11-12 DIAGNOSIS — Z03818 Encounter for observation for suspected exposure to other biological agents ruled out: Secondary | ICD-10-CM | POA: Diagnosis not present

## 2019-02-08 ENCOUNTER — Other Ambulatory Visit: Payer: Self-pay

## 2019-02-10 ENCOUNTER — Ambulatory Visit: Payer: BC Managed Care – PPO | Admitting: Obstetrics & Gynecology

## 2019-02-10 ENCOUNTER — Encounter: Payer: Self-pay | Admitting: Obstetrics & Gynecology

## 2019-02-10 ENCOUNTER — Other Ambulatory Visit: Payer: Self-pay

## 2019-02-10 ENCOUNTER — Telehealth: Payer: Self-pay | Admitting: *Deleted

## 2019-02-10 VITALS — BP 102/80 | HR 80 | Temp 97.8°F | Ht 65.5 in | Wt 194.0 lb

## 2019-02-10 DIAGNOSIS — Z01419 Encounter for gynecological examination (general) (routine) without abnormal findings: Secondary | ICD-10-CM

## 2019-02-10 DIAGNOSIS — Z0000 Encounter for general adult medical examination without abnormal findings: Secondary | ICD-10-CM | POA: Diagnosis not present

## 2019-02-10 DIAGNOSIS — Z1211 Encounter for screening for malignant neoplasm of colon: Secondary | ICD-10-CM | POA: Diagnosis not present

## 2019-02-10 DIAGNOSIS — Z113 Encounter for screening for infections with a predominantly sexual mode of transmission: Secondary | ICD-10-CM

## 2019-02-10 NOTE — Progress Notes (Addendum)
48 y.o. G2P1 Single White or Caucasian female here for annual exam.  Changed jobs last year with [employer] and is not doing any traveling.  She is a Cabin crew.  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  Denies vaginal bleeding.    Not with partner.  They broke up in March.  Would like STD testing.  Patient's last menstrual period was 11/17/2011.          Sexually active: Yes.    The current method of family planning is status post hysterectomy.    Exercising: Yes.    walking 3-4 miles, weights Smoker:  no  Health Maintenance: Pap:  12/11/11 Neg. HR HPV:neg History of abnormal Pap:  yes MMG:  08/28/15 BIRADS2:benign.  Discussed with her today.   Colonoscopy:  Never BMD:   never TDaP:  2014 Screening Labs: Here today - fasting    reports that she has quit smoking. She has never used smokeless tobacco. She reports current alcohol use of about 2.0 - 3.0 standard drinks of alcohol per week. She reports that she does not use drugs.  Past Medical History:  Diagnosis Date  . Abnormal Pap smear   . Complication of anesthesia   . Dog bite 04/2017    Past Surgical History:  Procedure Laterality Date  . ABDOMINAL HYSTERECTOMY    . CYSTOSCOPY  12/30/2011   Procedure: CYSTOSCOPY;  Surgeon: Lyman Speller, MD;  Location: [facility];  Service: Gynecology;  Laterality: N/A;  . SALPINGOOPHORECTOMY  12/30/2011   Procedure: SALPINGO OOPHERECTOMY;  Surgeon: Lyman Speller, MD;  Location: [facility];  Service: Gynecology;  Laterality: Left;  . TUBAL LIGATION      No current outpatient medications on file.   No current facility-administered medications for this visit.     Family History  Problem Relation Age of Onset  . Hypertension Mother   . Diabetes Mother   . Thyroid disease Mother   . Breast cancer Other        maternal cousin-1st , diag 85s    Review of Systems  All other systems reviewed and are negative.   Exam:   BP 102/80   Pulse 80   Temp 97.8 F (36.6 C) (Temporal)    Ht 5' 5.5" (1.664 m)   Wt 194 lb (88 kg)   LMP 11/17/2011   BMI 31.79 kg/m    Height: 5' 5.5" (166.4 cm)  Ht Readings from Last 3 Encounters:  02/10/19 5' 5.5" (1.664 m)  11/03/17 5' 5.5" (1.664 m)  08/05/16 5' 5.5" (1.664 m)    General appearance: alert, cooperative and appears stated age Head: Normocephalic, without obvious abnormality, atraumatic Neck: no adenopathy, supple, symmetrical, trachea midline and thyroid normal to inspection and palpation Lungs: clear to auscultation bilaterally Breasts: normal appearance, no masses or tenderness Heart: regular rate and rhythm Abdomen: soft, non-tender; bowel sounds normal; no masses,  no organomegaly Extremities: extremities normal, atraumatic, no cyanosis or edema Skin: Skin color, texture, turgor normal. No rashes or lesions Lymph nodes: Cervical, supraclavicular, and axillary nodes normal. No abnormal inguinal nodes palpated Neurologic: Grossly normal   Pelvic: External genitalia:  no lesions              Urethra:  normal appearing urethra with no masses, tenderness or lesions              Bartholins and Skenes: normal                 Vagina: normal appearing vagina  with normal color and discharge, no lesions              Cervix: absent              Pap taken: No. Bimanual Exam:  Uterus:  uterus absent              Adnexa: no mass, fullness, tenderness               Rectovaginal: Confirms               Anus:  normal sphincter tone, no lesions  Chaperone was present for exam.  A:  Well Woman with normal exam H/o TLH/LSO/right salpingectomy 6/13 H/o HSV 1 Desires STD testing Frustrations with weight  P:   Mammogram guidelines reviewed.  Pt is aware this is overdue.  Information given about scheduling. IFOB ordered for pt.  ACS screening guidelines reviewed. pap smear not indicated HIV, RPR, Hep B SAg, Hep C, GC/Chl obtained today CBC, CMP, Lipids, TSH and Vit D obtained today Vaccines reviewed. Return annually or  prn

## 2019-02-10 NOTE — Telephone Encounter (Signed)
Called patient. Left voicemail to call back.  IFOB kit ready to pick up at the office or we can mail it to her house.

## 2019-02-10 NOTE — Patient Instructions (Signed)
[facility] [address], [place] Phone: [phone]

## 2019-02-11 LAB — COMPREHENSIVE METABOLIC PANEL
ALT: 17 IU/L (ref 0–32)
AST: 18 IU/L (ref 0–40)
Albumin/Globulin Ratio: 1.9 (ref 1.2–2.2)
Albumin: 4.7 g/dL (ref 3.8–4.8)
Alkaline Phosphatase: 65 IU/L (ref 39–117)
BUN/Creatinine Ratio: 18 (ref 9–23)
BUN: 13 mg/dL (ref 6–24)
Bilirubin Total: 0.3 mg/dL (ref 0.0–1.2)
CO2: 20 mmol/L (ref 20–29)
Calcium: 9.4 mg/dL (ref 8.7–10.2)
Chloride: 101 mmol/L (ref 96–106)
Creatinine, Ser: 0.71 mg/dL (ref 0.57–1.00)
GFR calc Af Amer: 117 mL/min/{1.73_m2} (ref >59)
GFR calc non Af Amer: 102 mL/min/{1.73_m2} (ref >59)
Globulin, Total: 2.5 g/dL (ref 1.5–4.5)
Glucose: 96 mg/dL (ref 65–99)
Potassium: 4.4 mmol/L (ref 3.5–5.2)
Sodium: 137 mmol/L (ref 134–144)
Total Protein: 7.2 g/dL (ref 6.0–8.5)

## 2019-02-11 LAB — CBC
Hematocrit: 41.1 % (ref 34.0–46.6)
Hemoglobin: 13.7 g/dL (ref 11.1–15.9)
MCH: 30.4 pg (ref 26.6–33.0)
MCHC: 33.3 g/dL (ref 31.5–35.7)
MCV: 91 fL (ref 79–97)
Platelets: 306 10*3/uL (ref 150–450)
RBC: 4.5 x10E6/uL (ref 3.77–5.28)
RDW: 12.4 % (ref 11.7–15.4)
WBC: 6.5 10*3/uL (ref 3.4–10.8)

## 2019-02-11 LAB — HEP, RPR, HIV PANEL
HIV Screen 4th Generation wRfx: NONREACTIVE
Hepatitis B Surface Ag: NEGATIVE
RPR Ser Ql: NONREACTIVE

## 2019-02-11 LAB — TSH: TSH: 1.97 u[IU]/mL (ref 0.450–4.500)

## 2019-02-11 LAB — LIPID PANEL
Chol/HDL Ratio: 5.1 ratio — ABNORMAL HIGH (ref 0.0–4.4)
Cholesterol, Total: 179 mg/dL (ref 100–199)
HDL: 35 mg/dL — ABNORMAL LOW (ref >39)
LDL Calculated: 111 mg/dL — ABNORMAL HIGH (ref 0–99)
Triglycerides: 163 mg/dL — ABNORMAL HIGH (ref 0–149)
VLDL Cholesterol Cal: 33 mg/dL (ref 5–40)

## 2019-02-11 LAB — HEPATITIS C ANTIBODY: Hep C Virus Ab: <0.1 s/co ratio (ref 0.0–0.9)

## 2019-02-11 LAB — VITAMIN D 25 HYDROXY (VIT D DEFICIENCY, FRACTURES): Vit D, 25-Hydroxy: 28.9 ng/mL — ABNORMAL LOW (ref 30.0–100.0)

## 2019-02-11 NOTE — Telephone Encounter (Signed)
Call returned to patient, left detailed message, ok per dpr. Advised per [place], return call to office to advise on IFOB.

## 2019-02-11 NOTE — Telephone Encounter (Signed)
Patient left message over night returning call to [facility].

## 2019-02-11 NOTE — Telephone Encounter (Signed)
-----   Message from Megan Salon, MD sent at 02/11/2019  9:33 AM EDT ----- Please notify pt of negative Hep B and Hep C, negative syphilis testing and negative HIV.  Her CBC, CMP and TSh were all normal as well.  Her cholesterol was fine.  Total 179.  LDLs were a little elevated at 111 but no treatment is needed.  Triglycerides were a little elevated at 163 but again no treatment is needed for this.  Vit D was 29.  Goal is 30 so she does not need to add anything else for this.    GC/CHl/tric testing is still pending.

## 2019-02-15 LAB — CHLAMYDIA/GONOCOCCUS/TRICHOMONAS, NAA
Chlamydia by NAA: NEGATIVE
Gonococcus by NAA: NEGATIVE
Trich vag by NAA: NEGATIVE

## 2019-02-17 NOTE — Telephone Encounter (Signed)
Pt notified. Verbalized understanding. Patient states she will come by the office and pick up IFOB test kit.

## 2019-04-14 DIAGNOSIS — D2262 Melanocytic nevi of left upper limb, including shoulder: Secondary | ICD-10-CM | POA: Diagnosis not present

## 2019-04-14 DIAGNOSIS — L821 Other seborrheic keratosis: Secondary | ICD-10-CM | POA: Diagnosis not present

## 2019-04-14 DIAGNOSIS — Q825 Congenital non-neoplastic nevus: Secondary | ICD-10-CM | POA: Diagnosis not present

## 2019-04-14 DIAGNOSIS — D2261 Melanocytic nevi of right upper limb, including shoulder: Secondary | ICD-10-CM | POA: Diagnosis not present

## 2019-04-14 DIAGNOSIS — D485 Neoplasm of uncertain behavior of skin: Secondary | ICD-10-CM | POA: Diagnosis not present

## 2019-05-27 DIAGNOSIS — Z20828 Contact with and (suspected) exposure to other viral communicable diseases: Secondary | ICD-10-CM | POA: Diagnosis not present

## 2020-01-18 ENCOUNTER — Other Ambulatory Visit: Payer: Self-pay | Admitting: Obstetrics & Gynecology

## 2020-01-18 DIAGNOSIS — Z1231 Encounter for screening mammogram for malignant neoplasm of breast: Secondary | ICD-10-CM

## 2020-01-21 LAB — FECAL OCCULT BLOOD, IMMUNOCHEMICAL

## 2020-02-08 ENCOUNTER — Ambulatory Visit
Admission: RE | Admit: 2020-02-08 | Discharge: 2020-02-08 | Disposition: A | Discharge status: Home / Self Care | Payer: BC Managed Care – PPO | Source: Ambulatory Visit | Attending: Obstetrics & Gynecology | Admitting: Obstetrics & Gynecology

## 2020-02-08 ENCOUNTER — Other Ambulatory Visit: Payer: Self-pay

## 2020-02-08 DIAGNOSIS — Z1231 Encounter for screening mammogram for malignant neoplasm of breast: Secondary | ICD-10-CM

## 2020-02-08 DIAGNOSIS — Z1211 Encounter for screening for malignant neoplasm of colon: Secondary | ICD-10-CM | POA: Diagnosis not present

## 2020-02-08 DIAGNOSIS — E782 Mixed hyperlipidemia: Secondary | ICD-10-CM | POA: Diagnosis not present

## 2020-02-08 DIAGNOSIS — K219 Gastro-esophageal reflux disease without esophagitis: Secondary | ICD-10-CM | POA: Diagnosis not present

## 2020-02-10 ENCOUNTER — Other Ambulatory Visit: Payer: Self-pay | Admitting: Obstetrics & Gynecology

## 2020-02-10 DIAGNOSIS — R928 Other abnormal and inconclusive findings on diagnostic imaging of breast: Secondary | ICD-10-CM

## 2020-02-19 DIAGNOSIS — Z1211 Encounter for screening for malignant neoplasm of colon: Secondary | ICD-10-CM | POA: Diagnosis not present

## 2020-02-22 HISTORY — PX: BREAST BIOPSY: SHX20

## 2020-02-23 ENCOUNTER — Other Ambulatory Visit: Payer: Self-pay | Admitting: Obstetrics & Gynecology

## 2020-02-23 ENCOUNTER — Ambulatory Visit
Admission: RE | Admit: 2020-02-23 | Discharge: 2020-02-23 | Disposition: A | Discharge status: Home / Self Care | Payer: BC Managed Care – PPO | Source: Ambulatory Visit | Attending: Obstetrics & Gynecology | Admitting: Obstetrics & Gynecology

## 2020-02-23 ENCOUNTER — Other Ambulatory Visit: Payer: Self-pay

## 2020-02-23 DIAGNOSIS — R922 Inconclusive mammogram: Secondary | ICD-10-CM | POA: Diagnosis not present

## 2020-02-23 DIAGNOSIS — R921 Mammographic calcification found on diagnostic imaging of breast: Secondary | ICD-10-CM

## 2020-02-23 DIAGNOSIS — R928 Other abnormal and inconclusive findings on diagnostic imaging of breast: Secondary | ICD-10-CM

## 2020-02-26 DIAGNOSIS — Z20822 Contact with and (suspected) exposure to covid-19: Secondary | ICD-10-CM | POA: Diagnosis not present

## 2020-02-28 LAB — COLOGUARD: COLOGUARD: NEGATIVE

## 2020-03-07 ENCOUNTER — Other Ambulatory Visit: Payer: Self-pay

## 2020-03-07 ENCOUNTER — Ambulatory Visit
Admission: RE | Admit: 2020-03-07 | Discharge: 2020-03-07 | Disposition: A | Discharge status: Home / Self Care | Payer: BC Managed Care – PPO | Source: Ambulatory Visit | Attending: Obstetrics & Gynecology | Admitting: Obstetrics & Gynecology

## 2020-03-07 ENCOUNTER — Other Ambulatory Visit: Payer: Self-pay | Admitting: Obstetrics & Gynecology

## 2020-03-07 DIAGNOSIS — R921 Mammographic calcification found on diagnostic imaging of breast: Secondary | ICD-10-CM

## 2020-03-07 DIAGNOSIS — D0511 Intraductal carcinoma in situ of right breast: Secondary | ICD-10-CM | POA: Diagnosis not present

## 2020-03-13 DIAGNOSIS — E669 Obesity, unspecified: Secondary | ICD-10-CM | POA: Diagnosis not present

## 2020-03-13 DIAGNOSIS — Z0000 Encounter for general adult medical examination without abnormal findings: Secondary | ICD-10-CM | POA: Diagnosis not present

## 2020-03-14 ENCOUNTER — Encounter: Payer: Self-pay | Admitting: Adult Health

## 2020-03-14 DIAGNOSIS — D0511 Intraductal carcinoma in situ of right breast: Secondary | ICD-10-CM | POA: Insufficient documentation

## 2020-03-16 DIAGNOSIS — D0511 Intraductal carcinoma in situ of right breast: Secondary | ICD-10-CM | POA: Diagnosis not present

## 2020-03-19 NOTE — Progress Notes (Signed)
Radiation Oncology         [phone] ________________________________  Initial Outpatient Consultation  Name: Catherine Santos MRN: [id number]  Date: 03/20/2020  DOB: [birth year]  CC:Catherine Santos, [place]  Catherine Kussmaul, MD   REFERRING PHYSICIAN: Autumn Messing III, MD  DIAGNOSIS: D05.11   ICD-10-CM   1. Malignant neoplasm of overlapping sites of right breast in female, estrogen receptor positive ([place])  C50.811    Z17.0   2. Ductal carcinoma in situ (DCIS) of right breast  D05.11     Cancer Staging Malignant neoplasm of left breast in female, estrogen receptor positive ([place]) Staging form: Breast, AJCC 8th Edition - Clinical: Stage 0 (cTis (DCIS), cN0, cM0, ER+, PR+) - Signed by Gardenia Phlegm, NP on 03/14/2020   CHIEF COMPLAINT: Here to discuss management of right breast DCIS  HISTORY OF PRESENT ILLNESS::Catherine Santos is a 49 y.o. female who presented with breast abnormality on the following imaging: Bilateral screening mammogram on the date of 02/08/2020. No symptoms were reported at that time. Diagnostic mammogram of right breast on 02/23/2020 revealed an indeterminate 9 mm group of calcifications in the lateral posterior right breast. Biopsy on date of 03/07/2020 showed foci of ductal carcinoma in situ with calcifications.  ER status: 80%; PR status 50%; Grade 2-3.   Past/Anticipated interventions by surgeon, if any: Dr. Autumn Messing is referring patient for genetic testing. If her genetics are all negative he will plan to do breast conservation surgery. If her genetics are positive he will consider bilateral mastectomies with reconstruction.   Past/Anticipated interventions by medical oncology, if any: Referral has been made, but appointment has not been scheduled as of yet  Lymphedema issues, if any:  Patient denies   Pain issues, if any:  Patient denies   SAFETY ISSUES:  Prior radiation? No  Pacemaker/ICD? No  Possible current pregnancy? No--robotic assisted  laparoscopic hysterectomy   Is the patient on methotrexate? 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    PAST SURGICAL HISTORY: Past Surgical History:  Procedure Laterality Date  . CYSTOSCOPY  12/30/2011   Procedure: CYSTOSCOPY;  Surgeon: Lyman Speller, MD;  Location: [facility];  Service: Gynecology;  Laterality: N/A;  . ROBOTIC ASSISTED LAP VAGINAL HYSTERECTOMY    . SALPINGOOPHORECTOMY  12/30/2011   Procedure: SALPINGO OOPHERECTOMY;  Surgeon: Lyman Speller, MD;  Location: [facility];  Service: Gynecology;  Laterality: Left;  . TUBAL LIGATION      FAMILY HISTORY: family history includes Breast cancer in an other family member; Diabetes in her mother; Hypertension in her mother; Thyroid disease in her mother.  SOCIAL HISTORY:  reports that she has quit smoking. Her smoking use included cigarettes. She has never used smokeless tobacco. She reports current alcohol use of about 2.0 - 3.0 standard drinks of alcohol per week. She reports that she does not use drugs.  ALLERGIES: Phentermine, Penicillins, and Sulfa antibiotics  MEDICATIONS:  Current Outpatient Medications  Medication Sig Dispense Refill  . omeprazole (PRILOSEC) 20 MG capsule Take 20 mg by mouth daily. (Patient not taking: Reported on 03/20/2020)     No current facility-administered medications for this encounter.    REVIEW OF SYSTEMS: As above  PHYSICAL EXAM:  height is  5' 5"  (1.651 m) and weight is 201 lb 3.2 oz (91.3 kg). Her temperature is 98 F (36.7 C). Her blood pressure is 125/96 (abnormal) and her pulse is 82. Her respiration is 18 and oxygen saturation is 99%.   General: Alert and oriented, in no acute distress HEENT: Head is  normocephalic.  Musculoskeletal: Able to get on the examination table independently, ambulatory, symmetric strength and muscle tone throughout. Neurologic  No obvious focalities. Speech is fluent. Coordination is intact. Psychiatric: Judgment and insight are intact. Affect is appropriate. Breasts No palpable masses appreciated in the breasts or axillae bilaterally.    ECOG = 0  0 - Asymptomatic (Fully active, able to carry on all predisease activities without restriction)  1 - Symptomatic but completely ambulatory (Restricted in physically strenuous activity but ambulatory and able to carry out work of a light or sedentary nature. For example, light housework, office work)  2 - Symptomatic, <50% in bed during the day (Ambulatory and capable of all self care but unable to carry out any work activities. Up and about more than 50% of waking hours)  3 - Symptomatic, >50% in bed, but not bedbound (Capable of only limited self-care, confined to bed or chair 50% or more of waking hours)  4 - Bedbound (Completely disabled. Cannot carry on any self-care. Totally confined to bed or chair)  5 - Death   Eustace Pen MM, Creech RH, Tormey DC, et al. [phone]). "Toxicity and response criteria of the [facility]". [place] Oncol. 5 (6): 649-55   LABORATORY DATA:  Lab Results  Component Value Date   WBC 6.5 02/10/2019   HGB 13.7 02/10/2019   HCT 41.1 02/10/2019   MCV 91 02/10/2019   PLT 306 02/10/2019   CMP     Component Value Date/Time   NA 137 02/10/2019 0920   K 4.4 02/10/2019 0920   CL 101 02/10/2019 0920   CO2 20 02/10/2019 0920   GLUCOSE 96 02/10/2019 0920   GLUCOSE 106 (H) 08/05/2016 1422   BUN 13 02/10/2019 0920   CREATININE 0.71 02/10/2019 0920   CREATININE 0.83 08/05/2016 1422   CALCIUM 9.4 02/10/2019 0920   PROT 7.2 02/10/2019 0920   ALBUMIN 4.7 02/10/2019 0920   AST 18 02/10/2019 0920   ALT 17 02/10/2019 0920   ALKPHOS 65 02/10/2019 0920   BILITOT 0.3  02/10/2019 0920   GFRNONAA 102 02/10/2019 0920   GFRAA 117 02/10/2019 0920         RADIOGRAPHY: MM Digital Diagnostic Unilat R  Result Date: 02/23/2020 CLINICAL DATA:  Screening recall for right breast calcifications. EXAM: DIGITAL DIAGNOSTIC RIGHT MAMMOGRAM WITH CAD COMPARISON:  Previous exam(s). ACR Breast Density Category c: The breast tissue is heterogeneously dense, which may obscure small masses. FINDINGS: Spot compression magnification images through the lateral posterior right breast demonstrates a 9 mm group faint punctate calcifications, new from the prior exam. Mammographic images were processed with CAD. IMPRESSION: There is an indeterminate 9 mm group of calcifications in the lateral posterior right breast. RECOMMENDATION: Stereotactic biopsy is recommended for the right breast calcifications. This has been scheduled for 03/07/2020 at 8:30 a.m. I have discussed the findings and recommendations with the patient. If applicable, a reminder letter will be sent to the patient regarding the next appointment. BI-RADS CATEGORY  4: Suspicious. Electronically Signed   By: Ammie Ferrier M.D.   On: 02/23/2020 09:01   MM CLIP PLACEMENT RIGHT  Result Date: 03/07/2020 CLINICAL DATA:  Patient status post  stereotactic guided core needle biopsy right breast calcifications. EXAM: DIAGNOSTIC RIGHT MAMMOGRAM POST STEREOTACTIC BIOPSY COMPARISON:  Previous exam(s). FINDINGS: Mammographic images were obtained following stereotactic guided biopsy of right breast calcifications. The biopsy marking clip is located approximately 2.1 cm cranial to the site of biopsied calcifications. IMPRESSION: Approximate 2.1 cm cranial migration of the biopsy marking clip relative to the site of biopsy. Final Assessment: Post Procedure Mammograms for Marker Placement Electronically Signed   By: Lovey Newcomer M.D.   On: 03/07/2020 09:43   MM RT BREAST BX W LOC DEV 1ST LESION IMAGE BX SPEC STEREO GUIDE  Addendum Date: 03/09/2020     ADDENDUM REPORT: 03/09/2020 10:03 ADDENDUM: Pathology revealed FOCI OF DUCTAL CARCINOMA IN SITU, INTERMEDIATE TO HIGH GRADE, WITH CALCIFICATIONS of the Right breast, lateral. This was found to be concordant by Dr. Lovey Newcomer. Pathology results were discussed with the patient by telephone. The patient reported doing well after the biopsy with tenderness at the site. Post biopsy instructions and care were reviewed and questions were answered. The patient was encouraged to call [facility] for any additional concerns. My direct phone number was provided. Surgical consultation has been arranged with Dr. Autumn Messing at [facility] on March 16, 2020. Recommend a bilateral breast MRI for further evaluation of extent of disease given the High Grade histology, family history and heterogeneously dense breasts. Pathology results reported by Terie Purser, RN on 03/09/2020. Electronically Signed   By: Lovey Newcomer M.D.   On: 03/09/2020 10:03   Result Date: 03/09/2020 CLINICAL DATA:  Patient with indeterminate right breast calcifications. EXAM: RIGHT BREAST STEREOTACTIC CORE NEEDLE BIOPSY COMPARISON:  Previous exams. FINDINGS: The patient and I discussed the procedure of stereotactic-guided biopsy including benefits and alternatives. We discussed the high likelihood of a successful procedure. We discussed the risks of the procedure including infection, bleeding, tissue injury, clip migration, and inadequate sampling. Informed written consent was given. The usual time out protocol was performed immediately prior to the procedure. Using sterile technique and 1% Lidocaine as local anesthetic, under stereotactic guidance, a 9 gauge vacuum assisted device was used to perform core needle biopsy of calcifications within the outer right breast using a cranial approach. Specimen radiograph was performed showing calcifications. Specimens with calcifications are identified for pathology. Lesion  quadrant: Lower outer quadrant At the conclusion of the procedure, coil tissue marker clip was deployed into the biopsy cavity. Follow-up 2-view mammogram was performed and dictated separately. IMPRESSION: Stereotactic-guided biopsy of right breast calcifications. No apparent complications. Electronically Signed: By: Lovey Newcomer M.D. On: 03/07/2020 09:42      IMPRESSION/PLAN: Right Breast DCIS  It was a pleasure meeting the patient today. We discussed the risks, benefits, and side effects of radiotherapy; most likely she will be a good candidate for breast conserving surgery.  Assuming that she undergoes lumpectomy, I recommend radiotherapy to the right breast to reduce her risk of locoregional recurrence by half.  We discussed that radiation would take approximately 4 weeks to complete and that I would give the patient a few weeks to heal following surgery before starting treatment planning.  We spoke about acute effects including skin irritation and fatigue as well as much less common late effects including internal organ injury or irritation. We spoke about the latest technology that is used to minimize the risk of late effects for patients undergoing radiotherapy to the breast or chest wall. No guarantees of treatment were given. The patient is enthusiastic about proceeding  with treatment. I look forward to participating in the patient's care.  I will await her referral back to me for postoperative follow-up and eventual CT simulation/treatment planning.  She is a former smoker but not currently smoking.  I emphasized the importance of no longer smoking to minimize the risk of radiation side effects.  We discussed measures to reduce the risk of infection during the COVID-19 pandemic.  She has been vaccinated.  On date of service, in total, I spent 35 minutes on this encounter. Patient was seen in person.   __________________________________________   Eppie Gibson, MD  This document serves as a  record of services personally performed by Eppie Gibson, MD. It was created on his behalf by Clerance Lav, a trained medical scribe. The creation of this record is based on the scribe's personal observations and the provider's statements to them. This document has been checked and approved by the attending provider.

## 2020-03-19 NOTE — Progress Notes (Signed)
Location of Breast Cancer: Ductal carcinoma in situ (DCIS) of RIGHT breast, estrogen receptor positive  Histology per Pathology Report:  (definitive pathology pending until surgical intervention determined) 03/07/2020 Diagnosis Breast, right, needle core biopsy, lateral - FOCI OF DUCTAL CARCINOMA IN SITU, INTERMEDIATE TO HIGH GRADE, WITH CALCIFICATIONS, SEE NOTE  Receptor Status: ER(80%), PR (50%)  Did patient present with symptoms (if so, please note symptoms) or was this found on screening mammography?:  Patient had routine mammogram which found a 9 mm area of microcalcification in the lateral aspect of the right breast that looked abnormal.   Past/Anticipated interventions by surgeon, if any: Dr. Autumn Messing is referring patient for genetic testing. If her genetics are all negative he will plan to do breast conservation surgery. If her genetics are positive he will consider bilateral mastectomies with reconstruction.   Past/Anticipated interventions by medical oncology, if any: Referral has been made, but appointment has not been scheduled as of yet  Lymphedema issues, if any:  Patient denies   Pain issues, if any:  Patient denies   SAFETY ISSUES:  Prior radiation? No  Pacemaker/ICD? No  Possible current pregnancy? No--robotic assisted laparoscopic hysterectomy   Is the patient on methotrexate? No  Current Complaints / other details:  Nothing of note

## 2020-03-20 ENCOUNTER — Ambulatory Visit
Admission: RE | Admit: 2020-03-20 | Discharge: 2020-03-20 | Disposition: A | Discharge status: Home / Self Care | Payer: BC Managed Care – PPO | Source: Ambulatory Visit | Attending: Radiation Oncology | Admitting: Radiation Oncology

## 2020-03-20 ENCOUNTER — Telehealth: Payer: Self-pay | Admitting: Hematology and Oncology

## 2020-03-20 ENCOUNTER — Other Ambulatory Visit: Payer: Self-pay

## 2020-03-20 ENCOUNTER — Encounter: Payer: Self-pay | Admitting: Radiation Oncology

## 2020-03-20 VITALS — BP 125/96 | HR 82 | Temp 98.0°F | Resp 18 | Ht 65.0 in | Wt 201.2 lb

## 2020-03-20 DIAGNOSIS — C50811 Malignant neoplasm of overlapping sites of right female breast: Secondary | ICD-10-CM | POA: Insufficient documentation

## 2020-03-20 DIAGNOSIS — Z90710 Acquired absence of both cervix and uterus: Secondary | ICD-10-CM | POA: Diagnosis not present

## 2020-03-20 DIAGNOSIS — Z803 Family history of malignant neoplasm of breast: Secondary | ICD-10-CM | POA: Insufficient documentation

## 2020-03-20 DIAGNOSIS — Z170 Estrogen receptor positive status [ER+]: Secondary | ICD-10-CM | POA: Diagnosis not present

## 2020-03-20 DIAGNOSIS — Z90722 Acquired absence of ovaries, bilateral: Secondary | ICD-10-CM | POA: Insufficient documentation

## 2020-03-20 DIAGNOSIS — D0511 Intraductal carcinoma in situ of right breast: Secondary | ICD-10-CM | POA: Insufficient documentation

## 2020-03-20 DIAGNOSIS — Z87891 Personal history of nicotine dependence: Secondary | ICD-10-CM | POA: Diagnosis not present

## 2020-03-20 NOTE — Telephone Encounter (Signed)
Received new pt referrals from Dr. Marlou Starks for genetics and medonc. Ms. Catherine Santos has been cld and scheduled to see Cari on 9/30 at 2pm and to see Dr. Lindi Adie on 10/4 at 345pm. Pt aware to arrive 15 minutes early for both appts.

## 2020-03-22 ENCOUNTER — Inpatient Hospital Stay: Payer: BC Managed Care – PPO | Attending: Genetic Counselor | Admitting: Genetic Counselor

## 2020-03-22 ENCOUNTER — Inpatient Hospital Stay: Payer: BC Managed Care – PPO

## 2020-03-22 ENCOUNTER — Encounter: Payer: Self-pay | Admitting: *Deleted

## 2020-03-22 ENCOUNTER — Other Ambulatory Visit: Payer: Self-pay

## 2020-03-22 DIAGNOSIS — D0511 Intraductal carcinoma in situ of right breast: Secondary | ICD-10-CM | POA: Diagnosis not present

## 2020-03-22 DIAGNOSIS — Z803 Family history of malignant neoplasm of breast: Secondary | ICD-10-CM | POA: Diagnosis not present

## 2020-03-22 DIAGNOSIS — Z8042 Family history of malignant neoplasm of prostate: Secondary | ICD-10-CM

## 2020-03-22 DIAGNOSIS — Z800 Family history of malignant neoplasm of digestive organs: Secondary | ICD-10-CM | POA: Diagnosis not present

## 2020-03-23 ENCOUNTER — Ambulatory Visit: Payer: Self-pay | Admitting: General Surgery

## 2020-03-23 ENCOUNTER — Encounter: Payer: Self-pay | Admitting: Genetic Counselor

## 2020-03-23 DIAGNOSIS — Z8042 Family history of malignant neoplasm of prostate: Secondary | ICD-10-CM

## 2020-03-23 DIAGNOSIS — Z803 Family history of malignant neoplasm of breast: Secondary | ICD-10-CM | POA: Insufficient documentation

## 2020-03-23 DIAGNOSIS — D0511 Intraductal carcinoma in situ of right breast: Secondary | ICD-10-CM

## 2020-03-23 DIAGNOSIS — Z800 Family history of malignant neoplasm of digestive organs: Secondary | ICD-10-CM | POA: Insufficient documentation

## 2020-03-23 HISTORY — DX: Family history of malignant neoplasm of breast: Z80.3

## 2020-03-23 HISTORY — DX: Family history of malignant neoplasm of prostate: Z80.42

## 2020-03-23 NOTE — Progress Notes (Signed)
REFERRING PROVIDER: Jovita Kussmaul, MD [place],  [place] [address]  PRIMARY PROVIDER:  Willeen Niece, PA  PRIMARY REASON FOR VISIT:  1. Ductal carcinoma in situ (DCIS) of right breast   2. Family history of breast cancer   3. Family history of colon cancer   4. Family history of prostate cancer    HISTORY OF PRESENT ILLNESS:   Catherine Santos, a 49 y.o. female, was seen for a [facility] cancer genetics consultation at the request of Dr. Marlou Starks due to a personal history of ductal carcinoma in situ and a family history of breast, prostate, colon and GYN cancers.  Catherine Santos presents to clinic today to discuss the possibility of a hereditary predisposition to cancer, to discuss genetic testing, and to further clarify her future cancer risks, as well as potential cancer risks for family members.   In September 2021, at the age of 55, Catherine Santos was diagnosed with ductal carcinoma in situ of the right breast (ER+/PR+).  The preliminary treatment plan includes breast conserving surgery.   RISK FACTORS:  Menarche was at age 77.  First live birth at age 36.  OCP use for approximately 20 years.  Ovaries intact: one ovary intact.  Hysterectomy: yes at age 50 due to endometriosis  Menopausal status: postmenopausal.  HRT use: 0 years. Colonoscopy: no; normal fecal occult blood test in 12/2019 Mammogram within the last year: yes. Number of breast biopsies: 1. Any excessive radiation exposure in the past: no  Past Medical History:  Diagnosis Date   Abnormal Pap smear    Complication of anesthesia    Dog bite 04/2017   Family history of breast cancer 03/23/2020   Family history of prostate cancer 03/23/2020    Past Surgical History:  Procedure Laterality Date   CYSTOSCOPY  12/30/2011   Procedure: CYSTOSCOPY;  Surgeon: Lyman Speller, MD;  Location: [facility];  Service: Gynecology;  Laterality: N/A;   ROBOTIC ASSISTED LAP VAGINAL HYSTERECTOMY     SALPINGOOPHORECTOMY   12/30/2011   Procedure: SALPINGO OOPHERECTOMY;  Surgeon: Lyman Speller, MD;  Location: [facility];  Service: Gynecology;  Laterality: Left;   TUBAL LIGATION      Social History   Socioeconomic History   Marital status: Single    Spouse name: Not on file   Number of children: Not on file   Years of education: Not on file   Highest education level: Not on file  Occupational History   Not on file  Tobacco Use   Smoking status: Former Smoker    Types: Cigarettes   Smokeless tobacco: Never Used  Vaping Use   Vaping Use: Never used  Substance and Sexual Activity   Alcohol use: Yes    Alcohol/week: 2.0 - 3.0 standard drinks    Types: 2 - 3 Standard drinks or equivalent per week    Comment: on weekends   Drug use: No   Sexual activity: Yes    Partners: Male    Birth control/protection: Surgical    Comment: BTL, then TLH  Other Topics Concern   Not on file  Social History Narrative   Not on file   Social Determinants of Health   Financial Resource Strain:    Difficulty of Paying Living Expenses: Not on file  Food Insecurity:    Worried About Charity fundraiser in the Last Year: Not on file   [employer] of Food in the Last Year: Not on file  Transportation  Needs:    Lack of Transportation (Medical): Not on file   Lack of Transportation (Non-Medical): Not on file  Physical Activity:    Days of Exercise per Week: Not on file   Minutes of Exercise per Session: Not on file  Stress:    Feeling of Stress : Not on file  Social Connections:    Frequency of Communication with Friends and Family: Not on file   Frequency of Social Gatherings with Friends and Family: Not on file   Attends Religious Services: Not on file   Active Member of Clubs or Organizations: Not on file   Attends Archivist Meetings: Not on file   Marital Status: Not on file     FAMILY HISTORY:  We obtained a detailed, 4-generation family history.  Significant diagnoses  are listed below: Family History  Problem Relation Age of Onset   Breast cancer Cousin        maternal cousin-1st , diag 78s   Prostate cancer Maternal Uncle        dx unknown age   Lung cancer Paternal Grandfather        dx 75s; smoking hx   Cancer Maternal Uncle        unknown type; unknown age dx   Colon cancer Maternal Uncle        dx 39s   Cancer Cousin        maternal cousin; GYN cancer, unknown age dx     Catherine Santos has one son, age 35, who does not have cancer.  Her siblings, in their 44s, do not have a history of cancer.  Catherine Santos reports that her niece had genetic testing that showed a high risk for breast cancer, which was discovered upon a genetics workup for her great nephew, age 64, that had medical concerns.  No report is available for review. Catherine Santos mother is 26 years old and does not have a cancer history.  Catherine Santos had a maternal uncle with prostate cancer (diagnosed at an unknown age), a maternal uncle with colon cancer (diagnosed in his 17s), and a maternal uncle with an unknown type of cancer.  Catherine Santos also had a maternal first cousin diagnosed with breast cancer in her 30s and a maternal first cousin diagnosed with an unknown GYN cancer at an unknown age.  No other maternal family history of cancer was reported.  Catherine Santos father is 25 years old.  Her paternal grandfather passed away from lung cancer in his 72s.  No other paternal family history of cancer was reported.   Catherine Santos is unaware of previous family history of genetic testing for hereditary cancer risks besides that listed above. Patient's maternal ancestors are of White/Caucasian descent, and paternal ancestors are of White/Caucasian descent. There is no reported Ashkenazi Jewish ancestry. There is no known consanguinity.  GENETIC COUNSELING ASSESSMENT: Catherine Santos is a 49 y.o. female with a personal and family history of cancer which is somewhat suggestive of a hereditary cancer syndrome  and predisposition to cancer given the age of her diagnosis and the presence of related cancers in the family. We, therefore, discussed and recommended the following at today's visit.   DISCUSSION: We discussed that 5 - 10% of cancer is hereditary, with most cases of hereditary breast cancer associated with mutations in BRCA1 and BRCA2.  There are other genes that can be associated with hereditary breast cancer syndromes.  The type of cancer risk and level of risk are gene-specific.  We discussed that testing is beneficial for several reasons including knowing how to follow individuals after completing their treatment, identifying whether potential treatment options would be beneficial, and understanding if other family members could be at risk for cancer and allowing them to undergo genetic testing.  ° °We reviewed the characteristics, features and inheritance patterns of hereditary cancer syndromes. We also discussed genetic testing, including the appropriate family members to test, the process of testing, insurance coverage and turn-around-time for results. We discussed the implications of a negative, positive and/or variant of uncertain significant result. In order to get genetic test results in a timely manner so that Catherine Santos can use these genetic test results for surgical decisions, we recommended Catherine Santos pursue genetic testing for the STAT Breast Cancer Panel.  The STAT Breast cancer panel offered by [facility] includes sequencing and rearrangement analysis for the following 9 genes:  ATM, BRCA1, BRCA2, CDH1, CHEK2, PALB2, PTEN, STK11 and TP53.   °Once complete, we recommend Catherine Santos pursue reflex genetic testing to a more comprehensive gene panel.  ° °Catherine Santos  was offered a Santos hereditary cancer panel (48 genes) and an expanded pan-cancer panel (85 genes). Catherine Santos was informed of the benefits and limitations of each panel, including that expanded pan-cancer panels contain several preliminary  evidence genes that do not have clear management guidelines at this point in time.  We also discussed that as the number of genes included on a panel increases, the chances of variants of uncertain significance increases.  After considering the benefits and limitations of each gene panel, Catherine Santos  elected to have a Santos hereditary cancers panel through [facility]. ° °The [facility] Hereditary Cancers Panel offered by [facility] includes sequencing and/or deletion duplication testing of the following 48 genes: APC, ATM, AXIN2, BARD1, BMPR1A, BRCA1, BRCA2, BRIP1, CDH1, CDK4, CDKN2A (p14ARF), CDKN2A (p16INK4a), CHEK2, CTNNA1, DICER1, EPCAM (Deletion/duplication testing only), GREM1 (promoter region deletion/duplication testing only), KIT, MEN1, MLH1, MSH2, MSH3, MSH6, MUTYH, NBN, NF1, NHTL1, PALB2, PDGFRA, PMS2, POLD1, POLE, PTEN, RAD50, RAD51C, RAD51D, RNF43, SDHB, SDHC, SDHD, SMAD4, SMARCA4. STK11, TP53, TSC1, TSC2, and VHL.  The following genes were evaluated for sequence changes only: SDHA and HOXB13 c.251G>A variant only. ° °Based on Ms. Brutus's personal history of DCIS before the age of 50 and the family history of prostate and breast cancer, she meets NCCN medical criteria for genetic testing. Despite that she meets criteria, she may still have an out of pocket cost. We discussed that if her out of pocket cost for testing is over $100, the laboratory will call and confirm whether she wants to proceed with testing.  If the out of pocket cost of testing is less than $100 she will be billed by the genetic testing laboratory.  ° °PLAN: After considering the risks, benefits, and limitations, Ms. Smyth provided informed consent to pursue genetic testing and the blood sample was sent to [facility] for analysis of the STAT Breast Cancer Panel and [facility] Hereditary Cancers Panel. Results should be available within approximately 1-2 weeks' time, at which point they will be disclosed by telephone to Ms. Entwistle, as  will any additional recommendations warranted by these results. Ms. Schooler will receive a summary of her genetic counseling visit and a copy of her results once available. This information will also be available in Epic.  ° °Based on Ms. Greff's family history, we recommended her maternal cousin, who was diagnosed with breast cancer in her 40s, have genetic counseling and testing. Ms. Holzhauer   will let us know if we can be of any assistance in coordinating genetic counseling and/or testing for this family member.  ° °Lastly, we encouraged Ms. Freel to remain in contact with cancer genetics annually so that we can continuously update the family history and inform her of any changes in cancer genetics and testing that may be of benefit for this family.  ° °Ms. Betsill's questions were answered to her satisfaction today. Our contact information was provided should additional questions or concerns arise. Thank you for the referral and allowing us to share in the care of your patient.  ° °Cari M. Koerner, MS, CGC °Certified Genetic Counselor °[email or web address] °(P) [phone] ° °The patient was seen for a total of 35 minutes in face-to-face genetic counseling.  This patient was discussed with Drs. Magrinat, Gudena and/or Feng who agrees with the above.  ° °_______________________________________________________________________ °For Office Staff:  °Number of people involved in session: 1 °Was an Intern/ student involved with case: no ° °

## 2020-03-25 NOTE — Progress Notes (Signed)
[facility] CONSULT NOTE  Patient Care Team: Willeen Niece, [place] as PCP - General (Physician Assistant) Mauro Kaufmann, RN as Oncology Nurse Navigator Rockwell Germany, RN as Oncology Nurse Navigator  CHIEF COMPLAINTS/PURPOSE OF CONSULTATION:  Newly diagnosed breast cancer  HISTORY OF PRESENTING ILLNESS:  Catherine Santos 49 y.o. female is here because of recent diagnosis of right breast ductal carcinoma in situ. Screening mammogram on 02/08/20 detected right breast calcifications. Diagnostic mammogram on 02/23/20 showed a 0.9cm group of right breast calcifications. Biopsy on 03/07/20 showed ductal carcinoma in situ, grade 2-3, ER+ 80%, PR+ 50%. She presents to the clinic today for initial evaluation and discussion of treatment options.   I reviewed her records extensively and collaborated the history with the patient.  SUMMARY OF ONCOLOGIC HISTORY: Oncology History  Breast neoplasm, Tis (DCIS), right  03/14/2020 Initial Diagnosis   Screening mammogram detected right breast calcifications. Diagnostic mammogram showed a 0.9cm group of right breast calcifications. 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    03/14/2020 Cancer Staging   Staging form: Breast, AJCC 8th Edition - Clinical: Stage 0 (cTis (DCIS), cN0, cM0, ER+, PR+) - Signed by Gardenia Phlegm, NP on 03/14/2020      MEDICAL HISTORY:  Past Medical History:  Diagnosis Date  . Abnormal Pap smear   . Complication of anesthesia   . Dog bite 04/2017  . Family history of breast cancer 03/23/2020  . Family history of prostate cancer 03/23/2020    SURGICAL HISTORY: Past Surgical History:  Procedure Laterality Date  . CYSTOSCOPY  12/30/2011   Procedure: CYSTOSCOPY;  Surgeon: Lyman Speller, MD;  Location: [facility];  Service: Gynecology;  Laterality: N/A;  . ROBOTIC ASSISTED LAP VAGINAL HYSTERECTOMY    . SALPINGOOPHORECTOMY  12/30/2011   Procedure: SALPINGO OOPHERECTOMY;  Surgeon: Lyman Speller,  MD;  Location: [facility];  Service: Gynecology;  Laterality: Left;  . TUBAL LIGATION      SOCIAL HISTORY: Social History   Socioeconomic History  . Marital status: Single    Spouse name: Not on file  . Number of children: Not on file  . Years of education: Not on file  . Highest education level: Not on file  Occupational History  . Not on file  Tobacco Use  . Smoking status: Former Smoker    Types: Cigarettes  . Smokeless tobacco: Never Used  Vaping Use  . Vaping Use: Never used  Substance and Sexual Activity  . Alcohol use: Yes    Alcohol/week: 2.0 - 3.0 standard drinks    Types: 2 - 3 Standard drinks or equivalent per week    Comment: on weekends  . Drug use: No  . Sexual activity: Yes    Partners: Male    Birth control/protection: Surgical    Comment: BTL, then TLH  Other Topics Concern  . Not on file  Social History Narrative  . Not on file   Social Determinants of Health   Financial Resource Strain:   . Difficulty of Paying Living Expenses: Not on file  Food Insecurity:   . Worried About Charity fundraiser in the Last Year: Not on file  . Ran Out of Food in the Last Year: Not on file  Transportation Needs:   . Lack of Transportation (Medical): Not on file  . Lack of Transportation (Non-Medical): Not on file  Physical Activity:   . Days of Exercise per Week: Not on file  . Minutes of Exercise per  Session: Not on file  Stress:   . Feeling of Stress : Not on file  Social Connections:   . Frequency of Communication with Friends and Family: Not on file  . Frequency of Social Gatherings with Friends and Family: Not on file  . Attends Religious Services: Not on file  . Active Member of Clubs or Organizations: Not on file  . Attends Archivist Meetings: Not on file  . Marital Status: Not on file  Intimate Partner Violence:   . Fear of Current or Ex-Partner: Not on file  . Emotionally Abused: Not on file  . Physically Abused: Not on file  . Sexually  Abused: Not on file    FAMILY HISTORY: Family History  Problem Relation Age of Onset  . Hypertension Mother   . Diabetes Mother   . Thyroid disease Mother   . Breast cancer Cousin        maternal cousin-1st , diag 30s  . Prostate cancer Maternal Uncle        dx unknown age  . Lung cancer Paternal Grandfather        dx 15s; smoking hx  . Cancer Maternal Uncle        unknown type; unknown age dx  . Colon cancer Maternal Uncle        dx 67s  . Cancer Cousin        maternal cousin; GYN cancer, unknown age dx    ALLERGIES:  is allergic to phentermine, penicillins, and sulfa antibiotics.  MEDICATIONS:  Current Outpatient Medications  Medication Sig Dispense Refill  . omeprazole (PRILOSEC) 20 MG capsule Take 20 mg by mouth daily. (Patient not taking: Reported on 03/20/2020)     No current facility-administered medications for this visit.    REVIEW OF SYSTEMS:   As above all other systems are negative  PHYSICAL EXAMINATION: ECOG PERFORMANCE STATUS: 0 - Asymptomatic  Vitals:   03/26/20 1559  BP: (!) 136/94  Pulse: 91  Resp: 19  Temp: 97.9 F (36.6 C)  SpO2: 98%   Filed Weights   03/26/20 1559  Weight: 203 lb 3.2 oz (92.2 kg)      LABORATORY DATA:  I have reviewed the data as listed Lab Results  Component Value Date   WBC 6.5 02/10/2019   HGB 13.7 02/10/2019   HCT 41.1 02/10/2019   MCV 91 02/10/2019   PLT 306 02/10/2019   Lab Results  Component Value Date   NA 137 02/10/2019   K 4.4 02/10/2019   CL 101 02/10/2019   CO2 20 02/10/2019    RADIOGRAPHIC STUDIES: I have personally reviewed the radiological reports and agreed with the findings in the report.  ASSESSMENT AND PLAN:  Breast neoplasm, Tis (DCIS), right 03/14/2020:Screening mammogram detected right breast calcifications. Diagnostic mammogram showed a 0.9cm group of right breast calcifications. 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   Pathology review: I discussed with the patient the  difference between DCIS and invasive breast cancer. It is considered a precancerous lesion. DCIS is classified as a 0. It is generally detected through mammograms as calcifications. We discussed the significance of grades and its impact on prognosis. We also discussed the importance of ER and PR receptors and their implications to adjuvant treatment options. Prognosis of DCIS dependence on grade, comedo necrosis. It is anticipated that if not treated, 20-30% of DCIS can develop into invasive breast cancer.  Recommendation: 1. Breast conserving surgery 2. Followed by adjuvant radiation therapy 3. Followed by  antiestrogen therapy with tamoxifen 5 years  Tamoxifen counseling: We discussed the risks and benefits of tamoxifen. These include but not limited to insomnia, hot flashes, mood changes, vaginal dryness, and weight gain. Although rare, serious side effects including endometrial cancer, risk of blood clots were also discussed. We strongly believe that the benefits far outweigh the risks. Patient understands these risks and consented to starting treatment. Planned treatment duration is 5 years.  She works as a Cabin crew at [employer]. She stays extremely busy. She is awaiting the results of her genetic testing before scheduling her surgery. We briefly discussed the implications of BRCA mutation. She had prior hysterectomy and one ovary removal.  Return to clinic after surgery to discuss the final pathology report and come up with an adjuvant treatment plan.  All questions were answered. The patient knows to call the clinic with any problems, questions or concerns.   Rulon Eisenmenger, MD, MPH 03/26/2020    I, Molly Dorshimer, am acting as scribe for Nicholas Lose, MD.  I have reviewed the above documentation for accuracy and completeness, and I agree with the above.

## 2020-03-26 ENCOUNTER — Other Ambulatory Visit: Payer: Self-pay

## 2020-03-26 ENCOUNTER — Inpatient Hospital Stay: Payer: BC Managed Care – PPO | Attending: Hematology and Oncology | Admitting: Hematology and Oncology

## 2020-03-26 DIAGNOSIS — D0511 Intraductal carcinoma in situ of right breast: Secondary | ICD-10-CM | POA: Diagnosis not present

## 2020-03-26 NOTE — Assessment & Plan Note (Signed)
03/14/2020:Screening mammogram detected right breast calcifications. Diagnostic mammogram showed a 0.9cm group of right breast calcifications. 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   Pathology review: I discussed with the patient the difference between DCIS and invasive breast cancer. It is considered a precancerous lesion. DCIS is classified as a 0. It is generally detected through mammograms as calcifications. We discussed the significance of grades and its impact on prognosis. We also discussed the importance of ER and PR receptors and their implications to adjuvant treatment options. Prognosis of DCIS dependence on grade, comedo necrosis. It is anticipated that if not treated, 20-30% of DCIS can develop into invasive breast cancer.  Recommendation: 1. Breast conserving surgery 2. Followed by adjuvant radiation therapy 3. Followed by antiestrogen therapy with tamoxifen 5 years  Tamoxifen counseling: We discussed the risks and benefits of tamoxifen. These include but not limited to insomnia, hot flashes, mood changes, vaginal dryness, and weight gain. Although rare, serious side effects including endometrial cancer, risk of blood clots were also discussed. We strongly believe that the benefits far outweigh the risks. Patient understands these risks and consented to starting treatment. Planned treatment duration is 5 years.  Return to clinic after surgery to discuss the final pathology report and come up with an adjuvant treatment plan.

## 2020-03-27 ENCOUNTER — Encounter: Payer: Self-pay | Admitting: *Deleted

## 2020-03-28 ENCOUNTER — Ambulatory Visit: Payer: Self-pay | Admitting: Genetic Counselor

## 2020-03-28 ENCOUNTER — Encounter: Payer: Self-pay | Admitting: Genetic Counselor

## 2020-03-28 ENCOUNTER — Other Ambulatory Visit: Payer: Self-pay | Admitting: General Surgery

## 2020-03-28 ENCOUNTER — Telehealth: Payer: Self-pay | Admitting: Genetic Counselor

## 2020-03-28 DIAGNOSIS — Z800 Family history of malignant neoplasm of digestive organs: Secondary | ICD-10-CM

## 2020-03-28 DIAGNOSIS — Z1379 Encounter for other screening for genetic and chromosomal anomalies: Secondary | ICD-10-CM | POA: Insufficient documentation

## 2020-03-28 DIAGNOSIS — D0511 Intraductal carcinoma in situ of right breast: Secondary | ICD-10-CM

## 2020-03-28 DIAGNOSIS — Z8042 Family history of malignant neoplasm of prostate: Secondary | ICD-10-CM

## 2020-03-28 DIAGNOSIS — Z803 Family history of malignant neoplasm of breast: Secondary | ICD-10-CM

## 2020-03-28 NOTE — Progress Notes (Signed)
HPI:  Catherine Santos was previously seen in the [place] [facility] clinic due to a personal history of ductal carcinoma in situ, a family history of breast, prostate, and colon cancers, and concerns regarding a hereditary predisposition to cancer. Please refer to our prior cancer genetics clinic note for more information regarding our discussion, assessment and recommendations, at the time. Catherine Santos recent genetic test results were disclosed to her, as were recommendations warranted by these results. These results and recommendations are discussed in more detail below.  CANCER HISTORY:  Oncology History  Breast neoplasm, Tis (DCIS), right  03/14/2020 Initial Diagnosis   Screening mammogram detected right breast calcifications. Diagnostic mammogram showed a 0.9cm group of right breast calcifications. 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    03/14/2020 Cancer Staging   Staging form: Breast, AJCC 8th Edition - Clinical: Stage 0 (cTis (DCIS), cN0, cM0, ER+, PR+) - Signed by Gardenia Phlegm, NP on 03/14/2020   03/28/2020 Genetic Testing   Negative genetic testing: no pathogenic variants detected in [facility] Common Hereditary Cancers Panel.  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  The report date is March 28, 2020.   The Common Hereditary Cancers Panel offered by [facility] includes sequencing and/or deletion duplication testing of the following 48 genes: APC, ATM, AXIN2, BARD1, BMPR1A, BRCA1, BRCA2, BRIP1, CDH1, CDK4, CDKN2A (p14ARF), CDKN2A (p16INK4a), CHEK2, CTNNA1, DICER1, EPCAM (Deletion/duplication testing only), GREM1 (promoter region deletion/duplication testing only), KIT, MEN1, MLH1, MSH2, MSH3, MSH6, MUTYH, NBN, NF1, NHTL1, PALB2, PDGFRA, PMS2, POLD1, POLE, PTEN, RAD50, RAD51C, RAD51D, RNF43, SDHB, SDHC, SDHD, SMAD4, SMARCA4. STK11, TP53, TSC1, TSC2, and VHL.  The following genes were evaluated for sequence changes only: SDHA and HOXB13 c.251G>A  variant only.     FAMILY HISTORY:  We obtained a detailed, 4-generation family history.  Significant diagnoses are listed below: Family History  Problem Relation Age of Onset  . Breast cancer Cousin        maternal cousin-1st , diag 33s  . Prostate cancer Maternal Uncle        dx unknown age  . Lung cancer Paternal Grandfather        dx 69s; smoking hx  . Cancer Maternal Uncle        unknown type; unknown age dx  . Colon cancer Maternal Uncle        dx 64s  . Cancer Cousin        maternal cousin; GYN cancer, unknown age dx      Catherine Santos has one son, age 55, who does not have cancer.  Her siblings, in their 35s, do not have a history of cancer.  Catherine Santos reports that her niece had genetic testing that showed a high risk for breast cancer, which was discovered upon a genetics workup for her great nephew, age 37, that had medical concerns.  No report is available for review. Catherine Santos mother is 47 years old and does not have a cancer history.  Catherine Santos had a maternal uncle with prostate cancer (diagnosed at an unknown age), a maternal uncle with colon cancer (diagnosed in his 98s), and a maternal uncle with an unknown type of cancer.  Catherine Santos also had a maternal first cousin diagnosed with breast cancer in her 27s and a maternal first cousin diagnosed with an unknown GYN cancer at an unknown age.  No other maternal family history of cancer was reported.  Catherine Santos father is 56 years old.  Her paternal grandfather passed  away from lung cancer in his 64s.  No other paternal family history of cancer was reported.   Catherine Santos is unaware of previous family history of genetic testing for hereditary cancer risks besides that listed above. Patient's maternal ancestors are of White/Caucasian descent, and paternal ancestors are of White/Caucasian descent. There is no reported Ashkenazi Jewish ancestry. There is no known consanguinity.  GENETIC TEST RESULTS: Genetic testing reported out  on March 28, 2020 through [facility]. The Common Hereditary Cancers Panel found no pathogenic mutations. The Common Hereditary Cancers Panel offered by [facility] includes sequencing and/or deletion duplication testing of the following 48 genes: APC, ATM, AXIN2, BARD1, BMPR1A, BRCA1, BRCA2, BRIP1, CDH1, CDK4, CDKN2A (p14ARF), CDKN2A (p16INK4a), CHEK2, CTNNA1, DICER1, EPCAM (Deletion/duplication testing only), GREM1 (promoter region deletion/duplication testing only), KIT, MEN1, MLH1, MSH2, MSH3, MSH6, MUTYH, NBN, NF1, NHTL1, PALB2, PDGFRA, PMS2, POLD1, POLE, PTEN, RAD50, RAD51C, RAD51D, RNF43, SDHB, SDHC, SDHD, SMAD4, SMARCA4. STK11, TP53, TSC1, TSC2, and VHL.  The following genes were evaluated for sequence changes only: SDHA and HOXB13 c.251G>A variant only.   The test report has been scanned into EPIC and is located under the Molecular Pathology section of the Results Review tab.  A portion of the result report is included below for reference.     We discussed with Catherine Santos that because current genetic testing is not perfect, it is possible there may be a gene mutation in one of these genes that current testing cannot detect, but that chance is small.  We also discussed, that there could be another gene that has not yet been discovered, or that we have not yet tested, that is responsible for the cancer diagnoses in the family. It is also possible there is a hereditary cause for the cancer in the family that Catherine Santos did not inherit and therefore was not identified in her testing.  Therefore, it is important to remain in touch with cancer genetics in the future so that we can continue to offer Catherine Santos the most up to date genetic testing.   Genetic testing did identify a variant of uncertain significance (VUS) was identified in the [facility] gene called c.2108C>T (p.Ala703Val).  At this time, it is unknown if this variant is associated with increased cancer risk or if this is a normal finding,  but most variants such as this get reclassified to being inconsequential. It should not be used to make medical management decisions. With time, we suspect the lab will determine the significance of this variant, if any. If we do learn more about it, we will try to contact Catherine Santos to discuss it further. However, it is important to stay in touch with [place] periodically and keep the address and phone number up to date.  ADDITIONAL GENETIC TESTING: We discussed with Catherine Santos that there are other genes that are associated with increased cancer risk that can be analyzed. Should Catherine Santos wish to pursue additional genetic testing, we are happy to discuss and coordinate this testing, at any time.    CANCER SCREENING RECOMMENDATIONS: Catherine Santos test result is considered negative (normal).  This means that we have not identified a hereditary cause for her personal history of DCIS at this time. Most cancers happen by chance and this negative test suggests that her DCIS may fall into this category.    While reassuring, this does not definitively rule out a hereditary predisposition to cancer. It is still possible that there could be genetic mutations that are undetectable by current  technology. There could be genetic mutations in genes that have not been tested or identified to increase cancer risk.  Therefore, it is recommended she continue to follow the cancer management and screening guidelines provided by her oncology and primary healthcare provider.   An individual's cancer risk and medical management are not determined by genetic test results alone. Overall cancer risk assessment incorporates additional factors, including personal medical history, family history, and any available genetic information that may result in a personalized plan for cancer prevention and surveillance  RECOMMENDATIONS FOR FAMILY MEMBERS:  Individuals in this family might be at some increased risk of developing cancer, over the  general population risk, simply due to the family history of cancer.  We recommended women in this family have a yearly mammogram beginning at age 32, or 61 years younger than the earliest onset of cancer, an annual clinical breast exam, and perform monthly breast self-exams. Women in this family should also have a gynecological exam as recommended by their primary provider. All family members should be referred for colonoscopy starting at age 43.  It is also possible there is a hereditary cause for the cancer in Catherine Santos family that she did not inherit and therefore was not identified in her.  Based on Catherine Santos's family history, we recommended her maternal cousin, who was diagnosed with breast cancer in her 47s, have genetic counseling and testing. Ms. Ausburn will let us know if we can be of any assistance in coordinating genetic counseling and/or testing for this family member.   FOLLOW-UP: Lastly, we discussed with Ms. Charity that cancer genetics is a rapidly advancing field and it is possible that new genetic tests will be appropriate for her and/or her family members in the future. We encouraged her to remain in contact with cancer genetics on an annual basis so we can update her personal and family histories and let her know of advances in cancer genetics that may benefit this family.   Our contact number was provided. Ms. Dowe questions were answered to her satisfaction, and she knows she is welcome to call us at anytime with additional questions or concerns.   Barett Whidbee M. Joette Catching, [place], [place] Film/video [email or web address] (P) [phone]

## 2020-03-28 NOTE — Telephone Encounter (Signed)
Revealed negative genetic testing and VUS in SMARCA4.  Discussed that we do not know why she has DCIS or why there is cancer in the family. It could be sporadic, due to a different gene that we are not testing, or maybe our current technology may not be able to pick something up.  It will be important for her to keep in contact with genetics to keep up with whether additional testing may be needed.

## 2020-03-29 ENCOUNTER — Telehealth: Payer: Self-pay | Admitting: Licensed Clinical Social Worker

## 2020-03-29 ENCOUNTER — Telehealth: Payer: Self-pay | Admitting: Hematology and Oncology

## 2020-03-29 NOTE — Telephone Encounter (Signed)
No 10/4 los, no changes made to pt schedule

## 2020-03-29 NOTE — Telephone Encounter (Signed)
[place] [employer] representative attempted to contact patient by phone  to offer support and assess for needs for patient with new diagnosis of breast cancer. No answer. Left VM with direct contact information.     Latressa Harries, [place], [place] Worker [facility]

## 2020-03-30 ENCOUNTER — Telehealth: Payer: Self-pay | Admitting: Hematology and Oncology

## 2020-03-30 ENCOUNTER — Encounter: Payer: Self-pay | Admitting: *Deleted

## 2020-03-30 NOTE — Telephone Encounter (Signed)
Per 10/8 schedule message schedule patient for 11/12 and left message

## 2020-04-06 ENCOUNTER — Other Ambulatory Visit: Payer: Self-pay

## 2020-04-06 ENCOUNTER — Encounter: Payer: Self-pay | Admitting: Plastic Surgery

## 2020-04-06 ENCOUNTER — Ambulatory Visit: Payer: BC Managed Care – PPO | Admitting: Plastic Surgery

## 2020-04-06 ENCOUNTER — Institutional Professional Consult (permissible substitution): Payer: BC Managed Care – PPO | Admitting: Plastic Surgery

## 2020-04-06 VITALS — BP 122/85 | HR 93 | Temp 98.1°F | Ht 65.0 in | Wt 198.0 lb

## 2020-04-06 DIAGNOSIS — D0511 Intraductal carcinoma in situ of right breast: Secondary | ICD-10-CM | POA: Diagnosis not present

## 2020-04-06 DIAGNOSIS — Z1379 Encounter for other screening for genetic and chromosomal anomalies: Secondary | ICD-10-CM

## 2020-04-08 ENCOUNTER — Encounter: Payer: Self-pay | Admitting: Plastic Surgery

## 2020-04-08 NOTE — Progress Notes (Signed)
Patient ID: Catherine Santos, female    DOB: 1970-11-04, 49 y.o.   MRN: [id number]   Chief Complaint  Patient presents with  . Breast Cancer    The patient is a 49 yrs old wf here for consultation for breast reconstruction.  The patient was diagnosed with RIGHT breast ductal carcinoma in situ.  The patient underwent a screening mammogram in August 2021 in which calcifications were noted in the right breast.  Diagnostic mammogram was done in September and showed a 0.9 cm area of calcification.  The biopsy on September 15 showed ductal carcinoma in situ, grade 2-3.  It was estrogen and progesterone positive.  She had missed a couple of mammograms and so this was compared to mammograms from 2 or 3 years ago.  She had received encouragement to get the mammogram by her daughter-in-law who is in medical school.  She is 5 feet 5 inches tall and weights 198 pounds.  Her preop bra size is 36 C/D.  She would like to be around the same size and would like to keep her areolas.  She is currently working for [employer] as a Freight forwarder.  I have extensively reviewed the patient's records and collaborated the information with the patient.   Review of Systems  Constitutional: Negative for activity change and appetite change.  HENT: Negative.   Eyes: Negative.   Respiratory: Negative for chest tightness and shortness of breath.   Cardiovascular: Negative.   Gastrointestinal: Negative.   Endocrine: Negative.   Genitourinary: Negative.   Musculoskeletal: Negative.   Skin: Negative.   Hematological: Negative.   Psychiatric/Behavioral: Negative.     Past Medical History:  Diagnosis Date  . Abnormal Pap smear   . Complication of anesthesia   . Dog bite 04/2017  . Family history of breast cancer 03/23/2020  . Family history of prostate cancer 03/23/2020    Past Surgical History:  Procedure Laterality Date  . CYSTOSCOPY  12/30/2011   Procedure: CYSTOSCOPY;  Surgeon: Lyman Speller, MD;  Location: [facility];   Service: Gynecology;  Laterality: N/A;  . ROBOTIC ASSISTED LAP VAGINAL HYSTERECTOMY    . SALPINGOOPHORECTOMY  12/30/2011   Procedure: SALPINGO OOPHERECTOMY;  Surgeon: Lyman Speller, MD;  Location: [facility];  Service: Gynecology;  Laterality: Left;  . TUBAL LIGATION        Current Outpatient Medications:  .  omeprazole (PRILOSEC) 20 MG capsule, Take 20 mg by mouth daily. (Patient not taking: Reported on 03/20/2020), Disp: , Rfl:    Objective:   Vitals:   04/06/20 1322  BP: 122/85  Pulse: 93  Temp: 98.1 F (36.7 C)  SpO2: 97%    Physical Exam Vitals and nursing note reviewed.  Constitutional:      Appearance: Normal appearance.  HENT:     Head: Normocephalic and atraumatic.  Cardiovascular:     Rate and Rhythm: Normal rate.     Pulses: Normal pulses.  Pulmonary:     Effort: Pulmonary effort is normal.  Abdominal:     General: Abdomen is flat. There is no distension.     Tenderness: There is no abdominal tenderness.  Skin:    General: Skin is warm.     Capillary Refill: Capillary refill takes less than 2 seconds.  Neurological:     General: No focal deficit present.     Mental Status: She is alert and oriented to person, place, and time.  Psychiatric:        Mood and Affect:  Mood normal.        Thought Content: Thought content normal.     Assessment & Plan:  Breast neoplasm, Tis (DCIS), right  Genetic testing  We had a detailed conversation about the patient's options for breast reconstruction. Several reconstruction options were explained to the patient.  It is important to remember that breast reconstruction is an optional procedure. Reconstruction often requires several stages of surgery and this means more than one operation.  The surgeries are often done several months apart.  The entire process from start to finish can take a year or more. The major goal of breast reconstruction is to look normal in clothing. There will always be scars and a difference  noticeable without clothes.  This is true for asymmetries where both breasts will not be identical.  Surgery may be needed or desired to the non-cancerous breast in order to achieve better symmetry and satisfactory results.  Regardless of the reconstructive method, there is always risks and the possibility that the procedure will fail or have complications.  This coulds required additional surgeries.    We discussed the available methods of breast reconstruction and included:  1. Tissue expander with Acellular dermal matrix followed by implant based reconstruction. This can be done as one surgery or multiple surgeries.  2. Autologous reconstruction can include using a muscle or tissue from another area of the body for the reconstruction.  3. Combined procedures like the latissismus dorsi flaps that often uses the muscle with an expander or implant.  For each of the method discussed the risks, benefits, scars and recovery time were discussed in detail. Specific risks included bleeding, infection, hematoma, seroma, scarring, pain, wound healing complications, flap loss, fat necrosis, capsular contracture, need for implant removal, donor site complications, bulge, hernia, umbilical necrosis, need for urgent reoperation, and need for dressing changes.   After the options were discussed we focused on the patient's desires and the procedure that was best for her based on all the information.  Time with patient, chart review, discussion with consulting physician and documenting: a total of 50 minutes was spent in this was.  Greater than 70% was spent in counseling.   The patient would like to undergo an oncologic breast especially if the genetics is all negative which appears to be the case as listed in the chart.  I have spoken with Dr. Marlou Starks regarding the above information and we will plan to do a partial mastectomy of the right side and bilateral oncoplastic reduction together.  I will also talk with the  patient in 1 week to be sure she is still comfortable with this decision just to give her time to think it over. Pictures were obtained of the patient and placed in the chart with the patient's or guardian's permission.  [place], DO

## 2020-04-09 ENCOUNTER — Encounter: Payer: Self-pay | Admitting: *Deleted

## 2020-04-09 DIAGNOSIS — D0511 Intraductal carcinoma in situ of right breast: Secondary | ICD-10-CM

## 2020-04-10 ENCOUNTER — Ambulatory Visit: Payer: Self-pay | Admitting: General Surgery

## 2020-04-10 ENCOUNTER — Encounter: Payer: Self-pay | Admitting: Plastic Surgery

## 2020-04-16 NOTE — Progress Notes (Signed)
ICD-10-CM   1. Breast neoplasm, Tis (DCIS), right  D05.11       Patient ID: Catherine Santos, female    DOB: 06-15-1971, 49 y.o.   MRN: [id number]   History of Present Illness: Catherine Santos is a 49 y.o.  female  with a history of right breast ductal carcinoma in situ.  She presents for preoperative evaluation for upcoming procedure, right breast lumpectomy with radioactive seed localization with Dr. Marlou Starks and oncoplastic bilateral breast reduction with Dr. Marla Roe, scheduled for 04/27/2020.  Summary from previous visit: Patient was diagnosed with right breast ductal carcinoma in situ.  Diagnostic mammogram was done in September showing a 0.9 cm area of calcification.  Biopsy on September 15 showed ductal carcinoma in situ, grade 2-3.  ER/PR+.  She is 5 feet 5 inches tall and weighs 198 pounds.  Her preop bra size is 36 C/D.   Job: Freight forwarder at [employer]  [place] Significant for: none  The patient has not had problems with anesthesia.  Past Medical History: Allergies: Allergies  Allergen Reactions  . Phentermine Palpitations  . Penicillins Other (See Comments)    Pt doesn't remember reaction to PCN- was in childhood  . Sulfa Antibiotics Rash    Current Medications:  Current Outpatient Medications:  .  CALCIUM PO, Take by mouth., Disp: , Rfl:  .  ELDERBERRY PO, Take by mouth. + vitamin c, zinc, Disp: , Rfl:  .  Multiple Vitamin (MULTIVITAMIN PO), Take by mouth., Disp: , Rfl:  .  ciprofloxacin (CIPRO) 500 MG tablet, Take 1 tablet (500 mg total) by mouth 2 (two) times daily for 5 days., Disp: 10 tablet, Rfl: 0 .  HYDROcodone-acetaminophen (NORCO) 5-325 MG tablet, Take 1 tablet by mouth every 8 (eight) hours as needed for up to 7 days for severe pain. For use AFTER Surgery, Disp: 21 tablet, Rfl: 0 .  ondansetron (ZOFRAN) 4 MG tablet, Take 1 tablet (4 mg total) by mouth every 8 (eight) hours as needed for nausea or vomiting., Disp: 20 tablet, Rfl: 0  Past Medical  Problems: Past Medical History:  Diagnosis Date  . Abnormal Pap smear   . Cancer [facility])    breast cancer  . Dog bite 04/2017  . Family history of breast cancer 03/23/2020  . Family history of prostate cancer 03/23/2020    Past Surgical History: Past Surgical History:  Procedure Laterality Date  . CYSTOSCOPY  12/30/2011   Procedure: CYSTOSCOPY;  Surgeon: Lyman Speller, MD;  Location: [facility];  Service: Gynecology;  Laterality: N/A;  . ROBOTIC ASSISTED LAP VAGINAL HYSTERECTOMY    . SALPINGOOPHORECTOMY  12/30/2011   Procedure: SALPINGO OOPHERECTOMY;  Surgeon: Lyman Speller, MD;  Location: [facility];  Service: Gynecology;  Laterality: Left;  . TUBAL LIGATION      Social History: Social History   Socioeconomic History  . Marital status: Single    Spouse name: Not on file  . Number of children: Not on file  . Years of education: Not on file  . Highest education level: Not on file  Occupational History  . Not on file  Tobacco Use  . Smoking status: Former Smoker    Types: Cigarettes  . Smokeless tobacco: Never Used  Vaping Use  . Vaping Use: Never used  Substance and Sexual Activity  . Alcohol use: Yes    Alcohol/week: 2.0 - 3.0 standard drinks    Types: 2 - 3 Standard drinks or equivalent per week  Comment: on weekends  . Drug use: No  . Sexual activity: Yes    Partners: Male    Birth control/protection: Surgical    Comment: BTL, then TLH  Other Topics Concern  . Not on file  Social History Narrative  . Not on file   Social Determinants of Health   Financial Resource Strain:   . Difficulty of Paying Living Expenses: Not on file  Food Insecurity:   . Worried About Charity fundraiser in the Last Year: Not on file  . Ran Out of Food in the Last Year: Not on file  Transportation Needs:   . Lack of Transportation (Medical): Not on file  . Lack of Transportation (Non-Medical): Not on file  Physical Activity:   . Days of Exercise per Week: Not on file  .  Minutes of Exercise per Session: Not on file  Stress:   . Feeling of Stress : Not on file  Social Connections:   . Frequency of Communication with Friends and Family: Not on file  . Frequency of Social Gatherings with Friends and Family: Not on file  . Attends Religious Services: Not on file  . Active Member of Clubs or Organizations: Not on file  . Attends Archivist Meetings: Not on file  . Marital Status: Not on file  Intimate Partner Violence:   . Fear of Current or Ex-Partner: Not on file  . Emotionally Abused: Not on file  . Physically Abused: Not on file  . Sexually Abused: Not on file    Family History: Family History  Problem Relation Age of Onset  . Hypertension Mother   . Diabetes Mother   . Thyroid disease Mother   . Breast cancer Cousin        maternal cousin-1st , diag 70s  . Prostate cancer Maternal Uncle        dx unknown age  . Lung cancer Paternal Grandfather        dx 68s; smoking hx  . Cancer Maternal Uncle        unknown type; unknown age dx  . Colon cancer Maternal Uncle        dx 66s  . Cancer Cousin        maternal cousin; GYN cancer, unknown age dx    Review of Systems: Review of Systems  Constitutional: Negative for chills and fever.  HENT: Negative for congestion and sore throat.   Respiratory: Negative for cough and shortness of breath.   Cardiovascular: Negative for chest pain.  Gastrointestinal: Negative for abdominal pain, nausea and vomiting.  Skin: Negative for itching and rash.    Physical Exam: Vital Signs BP 122/83 (BP Location: Left Arm, Patient Position: Sitting, Cuff Size: Large)   Pulse 85   Temp 98.1 F (36.7 C) (Oral)   Ht 5' 5"  (1.651 m)   Wt 202 lb 9.6 oz (91.9 kg)   LMP 11/17/2011   SpO2 98%   BMI 33.71 kg/m  Physical Exam Vitals and nursing note reviewed.  Constitutional:      General: She is not in acute distress.    Appearance: Normal appearance. She is not ill-appearing.  HENT:     Head:  Normocephalic and atraumatic.  Eyes:     Extraocular Movements: Extraocular movements intact.  Cardiovascular:     Rate and Rhythm: Normal rate and regular rhythm.     Pulses: Normal pulses.     Heart sounds: Normal heart sounds.  Pulmonary:     Effort:  Pulmonary effort is normal.     Breath sounds: Normal breath sounds. No wheezing, rhonchi or rales.  Abdominal:     General: Bowel sounds are normal.     Palpations: Abdomen is soft.  Musculoskeletal:        General: No swelling. Normal range of motion.     Cervical back: Normal range of motion.  Skin:    General: Skin is warm and dry.     Coloration: Skin is not pale.     Findings: No erythema or rash.  Neurological:     General: No focal deficit present.     Mental Status: She is alert and oriented to person, place, and time.  Psychiatric:        Mood and Affect: Mood normal.        Behavior: Behavior normal.        Thought Content: Thought content normal.        Judgment: Judgment normal.     Assessment/Plan:  Ms. Port scheduled for right breast lumpectomy with radioactive seed localization with Dr. Marlou Starks and oncoplastic bilateral breast reduction with Dr. Marla Roe.  Risks, benefits, and alternatives of procedure discussed, questions answered and consent obtained.    Smoking Status: nonsmoker; Counseling Given? N/A Last Mammogram: September 2021; Results: Right breast ductal carcinoma in situ, grade 2-3 (biopsy)  Caprini Score: 6 high; Risk Factors include: 49 year old female, Hx cancer, BMI > 25, and length of planned surgery. Recommendation for mechanical and pharmacological prophylaxis during surgery. Encourage early ambulation.   Pictures obtained: 04/06/2020  Post-op Rx sent to pharmacy: Norco, Cipro, Zofran  Patient was provided with the breast reduction risks and General Surgical Risk consent document and Pain Medication Agreement prior to their appointment.  They had adequate time to read through the risk  consent documents and Pain Medication Agreement. We also discussed them in person together during this preop appointment. All of their questions were answered to their satisfaction.  Recommended calling if they have any further questions.  Risk consent form and Pain Medication Agreement to be scanned into patient's chart.  The risk that can be encountered with breast reduction were discussed and include the following but not limited to these:  Breast asymmetry, fluid accumulation, firmness of the breast, inability to breast feed, loss of nipple or areola, skin loss, decrease or no nipple sensation, fat necrosis of the breast tissue, bleeding, infection, healing delay.  There are risks of anesthesia, changes to skin sensation and injury to nerves or blood vessels.  The muscle can be temporarily or permanently injured.  You may have an allergic reaction to tape, suture, glue, blood products which can result in skin discoloration, swelling, pain, skin lesions, poor healing.  Any of these can lead to the need for revisonal surgery or stage procedures.  A reduction has potential to interfere with diagnostic procedures.  Nipple or breast piercing can increase risks of infection.  This procedure is best done when the breast is fully developed.  Changes in the breast will continue to occur over time.  Pregnancy can alter the outcomes of previous breast reduction surgery, weight gain and weigh loss can also effect the long term appearance.   Electronically signed by: Threasa Heads, PA-C 04/19/2020 2:25 PM

## 2020-04-16 NOTE — Progress Notes (Signed)
49 y.o. G2P1 Single White or Caucasian female here for annual exam.  Has been diagnosed with DCIS.  Genetic testing was all negative.  Has seen Dr. Lindi Adie.  Will likely be on Tamoxifen after radiation is completed.  Lumpectomy scheduled 04/27/2020 with Marlou Starks.    Denies vaginal bleeding.    Not traveling with work and this has been great!!  Son is married and his wife is a second year Careers information officer in [place].  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    Patient's last menstrual period was 11/17/2011.          Sexually active: Yes.    The current method of family planning is status post hysterectomy.    Exercising: Yes.    walking Smoker:  no  Health Maintenance: Pap:  12-11-11 neg HPV HR neg History of abnormal Pap:  yes MMG:  See reports, breast cancer, surgery next week Colonoscopy:  Guidelines reviewed.  cologuard negative 02/08/2020 and in [place].  Done with [facility].   BMD:   none TDaP:  2014 Pneumonia vaccine(s):  no Shingrix:   no Hep C testing: neg 2020 Screening Labs: 02/2020   reports that she has quit smoking. Her smoking use included cigarettes. She has never used smokeless tobacco. She reports current alcohol use of about 2.0 - 3.0 standard drinks of alcohol per week. She reports that she does not use drugs.  Past Medical History:  Diagnosis Date  . Abnormal Pap smear   . Cancer [facility])    breast cancer  . Complication of anesthesia   . Dog bite 04/2017  . Family history of breast cancer 03/23/2020  . Family history of prostate cancer 03/23/2020    Past Surgical History:  Procedure Laterality Date  . CYSTOSCOPY  12/30/2011   Procedure: CYSTOSCOPY;  Surgeon: Lyman Speller, MD;  Location: [facility];  Service: Gynecology;  Laterality: N/A;  . ROBOTIC ASSISTED LAP VAGINAL HYSTERECTOMY    . SALPINGOOPHORECTOMY  12/30/2011   Procedure: SALPINGO OOPHERECTOMY;   Surgeon: Lyman Speller, MD;  Location: [facility];  Service: Gynecology;  Laterality: Left;  . TUBAL LIGATION      Current Outpatient Medications  Medication Sig Dispense Refill  . CALCIUM PO Take by mouth.    . ELDERBERRY PO Take by mouth. + vitamin c, zinc    . Multiple Vitamin (MULTIVITAMIN PO) Take by mouth.     No current facility-administered medications for this visit.    Family History  Problem Relation Age of Onset  . Hypertension Mother   . Diabetes Mother   . Thyroid disease Mother   . Breast cancer Cousin        maternal cousin-1st , diag 93s  . Prostate cancer Maternal Uncle        dx unknown age  . Lung cancer Paternal Grandfather        dx 15s; smoking hx  . Cancer Maternal Uncle        unknown type; unknown age dx  . Colon cancer Maternal Uncle        dx 24s  . Cancer Cousin        maternal cousin; GYN cancer, unknown age dx    Review of Systems  Constitutional: Negative.   HENT: Negative.   Eyes: Negative.   Respiratory: Negative.   Cardiovascular: Negative.   Gastrointestinal: Negative.   Endocrine: Negative.  Genitourinary: Negative.   Musculoskeletal: Negative.   Skin: Negative.   Allergic/Immunologic: Negative.   Neurological: Negative.   Hematological: Negative.   Psychiatric/Behavioral: Negative.     Exam:   BP 114/70   Pulse 68   Resp 16   Ht 5' 5.75" (1.67 m)   Wt 201 lb (91.2 kg)   LMP 11/17/2011   BMI 32.69 kg/m   Height: 5' 5.75" (167 cm)  General appearance: alert, cooperative and appears stated age Head: Normocephalic, without obvious abnormality, atraumatic Neck: no adenopathy, supple, symmetrical, trachea midline and thyroid normal to inspection and palpation Lungs: clear to auscultation bilaterally Breasts: normal appearance, no masses or tenderness Heart: regular rate and rhythm Abdomen: soft, non-tender; bowel sounds normal; no masses,  no organomegaly Extremities: extremities normal, atraumatic, no cyanosis or  edema Skin: Skin color, texture, turgor normal. No rashes or lesions Lymph nodes: Cervical, supraclavicular, and axillary nodes normal. No abnormal inguinal nodes palpated Neurologic: Grossly normal   Pelvic: External genitalia:  no lesions              Urethra:  normal appearing urethra with no masses, tenderness or lesions              Bartholins and Skenes: normal                 Vagina: normal appearing vagina with normal color and discharge, no lesions              Cervix: absent              Pap taken: No. Bimanual Exam:  Uterus:  uterus absent              Adnexa: normal adnexa and no mass, fullness, tenderness               Rectovaginal: Confirms               Anus:  normal sphincter tone, no lesions  Chaperone, Terence Lux, CMA, was present for exam.  A:  Well Woman with normal exam H/o TL/LSO/right salpingectomy 6/13 H/o facial HSV 1 Recent DCIS diagnosis  P:   Mammogram guidelines reviewed pap smear not indicated Cologuard done with Willeen Niece and is visible in [facility] work up to date.  Vaccines reviewed. Return annually or prn

## 2020-04-19 ENCOUNTER — Encounter: Payer: Self-pay | Admitting: Obstetrics & Gynecology

## 2020-04-19 ENCOUNTER — Ambulatory Visit (INDEPENDENT_AMBULATORY_CARE_PROVIDER_SITE_OTHER): Payer: BC Managed Care – PPO | Admitting: Plastic Surgery

## 2020-04-19 ENCOUNTER — Encounter: Payer: Self-pay | Admitting: Plastic Surgery

## 2020-04-19 ENCOUNTER — Ambulatory Visit: Payer: BC Managed Care – PPO | Admitting: Obstetrics & Gynecology

## 2020-04-19 ENCOUNTER — Other Ambulatory Visit: Payer: Self-pay

## 2020-04-19 ENCOUNTER — Other Ambulatory Visit (HOSPITAL_COMMUNITY): Payer: BC Managed Care – PPO

## 2020-04-19 ENCOUNTER — Encounter (HOSPITAL_BASED_OUTPATIENT_CLINIC_OR_DEPARTMENT_OTHER): Payer: Self-pay | Admitting: General Surgery

## 2020-04-19 VITALS — BP 114/70 | HR 68 | Resp 16 | Ht 65.75 in | Wt 201.0 lb

## 2020-04-19 VITALS — BP 122/83 | HR 85 | Temp 98.1°F | Ht 65.0 in | Wt 202.6 lb

## 2020-04-19 DIAGNOSIS — D0511 Intraductal carcinoma in situ of right breast: Secondary | ICD-10-CM

## 2020-04-19 DIAGNOSIS — Z01419 Encounter for gynecological examination (general) (routine) without abnormal findings: Secondary | ICD-10-CM

## 2020-04-19 MED ORDER — CIPROFLOXACIN HCL 500 MG PO TABS: 500.0000 mg | ORAL_TABLET | Freq: Two times a day (BID) | ORAL | 0 refills | Status: AC

## 2020-04-19 MED ORDER — HYDROCODONE-ACETAMINOPHEN 5-325 MG PO TABS
1.0000 | ORAL_TABLET | Freq: Three times a day (TID) | ORAL | 0 refills | Status: AC | PRN
Start: 2020-04-19 — End: 2020-04-26

## 2020-04-19 MED ORDER — ONDANSETRON HCL 4 MG PO TABS: 4.0000 mg | ORAL_TABLET | Freq: Three times a day (TID) | ORAL | 0 refills | Status: DC | PRN

## 2020-04-19 NOTE — Progress Notes (Signed)
° ° ° ° °  Enhanced Recovery after Surgery for Orthopedics Enhanced Recovery after Surgery is a protocol used to improve the stress on your body and your recovery after surgery.  Patient Instructions   The night before surgery:  o No food after midnight. ONLY clear liquids after midnight   The day of surgery (if you do NOT have diabetes):  o Drink ONE (1) Pre-Surgery Clear Ensure as directed.   o This drink was given to you during your hospital  pre-op appointment visit. o The pre-op nurse will instruct you on the time to drink the  Pre-Surgery Ensure depending on your surgery time. o Finish the drink at the designated time by the pre-op nurse.  o Nothing else to drink after completing the  Pre-Surgery Clear Ensure.   The day of surgery (if you have diabetes): o Drink ONE (1) Gatorade 2 (G2) as directed. o This drink was given to you during your hospital  pre-op appointment visit.  o The pre-op nurse will instruct you on the time to drink the   Gatorade 2 (G2) depending on your surgery time. o Color of the Gatorade may vary. Red is not allowed. o Nothing else to drink after completing the  Gatorade 2 (G2).         If you have questions, please contact your surgeons office. Surgical soap given with instructions, pt verbalized understanding.

## 2020-04-20 ENCOUNTER — Telehealth (INDEPENDENT_AMBULATORY_CARE_PROVIDER_SITE_OTHER): Payer: BC Managed Care – PPO | Admitting: Plastic Surgery

## 2020-04-20 ENCOUNTER — Encounter: Payer: Self-pay | Admitting: Plastic Surgery

## 2020-04-20 DIAGNOSIS — D0511 Intraductal carcinoma in situ of right breast: Secondary | ICD-10-CM

## 2020-04-20 NOTE — Progress Notes (Signed)
I connected with  Catherine Santos on 04/20/20 by a phone enabled telemedicine application and verified that I am speaking with the correct person using two identifiers.  I was at the office and the patient was at home.   I discussed the limitations of evaluation and management by telemedicine. The patient expressed understanding and agreed to proceed.  We spent 10 minutes discussing the following information.  The patient is a 49 yrs old female joining me on the phone to further discuss her upcoming surgery.  She was diagnosed with right breast ductal carcinoma in situ.  The August 2021 mammogram showed calcifications of the right breast.  This led to biopsies which showed ductal carcinoma in situ that is estrogen and progesterone positive it appears to be in the deep lateral aspect of the right breast according to the reports that I have reviewed again today.  She is 5 feet 5 inches tall and weighs 198 pounds.  Her preoperative bra size is a 36 C/D.  She would like to be as close to this as possible but is still happy to get the mastopexy.  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  I have spoken to Dr. Marlou Starks about the above information.  We are planning on doing the surgery next week. The plan will be for bilateral immediate oncoplastic breast reduction.

## 2020-04-23 HISTORY — PX: BREAST LUMPECTOMY: SHX2

## 2020-04-23 HISTORY — PX: REDUCTION MAMMAPLASTY: SUR839

## 2020-04-24 ENCOUNTER — Other Ambulatory Visit (HOSPITAL_COMMUNITY)
Admission: RE | Admit: 2020-04-24 | Discharge: 2020-04-24 | Disposition: A | Discharge status: Home / Self Care | Payer: BC Managed Care – PPO | Source: Ambulatory Visit | Attending: General Surgery | Admitting: General Surgery

## 2020-04-24 DIAGNOSIS — Z20822 Contact with and (suspected) exposure to covid-19: Secondary | ICD-10-CM | POA: Insufficient documentation

## 2020-04-24 DIAGNOSIS — Z01812 Encounter for preprocedural laboratory examination: Secondary | ICD-10-CM | POA: Insufficient documentation

## 2020-04-24 LAB — SARS CORONAVIRUS 2 (TAT 6-24 HRS): SARS Coronavirus 2: NEGATIVE

## 2020-04-25 ENCOUNTER — Telehealth: Payer: Self-pay | Admitting: *Deleted

## 2020-04-25 NOTE — Telephone Encounter (Signed)
Patient would like to switch from Dr. Lindi Adie to Dr. Jana Hakim.  Dr. Lindi Adie aware and gave approval.  Appointment scheduled and confirmed with the patient for 11/19 at 8am with Dr. Jana Hakim.

## 2020-04-26 ENCOUNTER — Ambulatory Visit
Admission: RE | Admit: 2020-04-26 | Discharge: 2020-04-26 | Disposition: A | Discharge status: Home / Self Care | Payer: BC Managed Care – PPO | Source: Ambulatory Visit | Attending: General Surgery | Admitting: General Surgery

## 2020-04-26 ENCOUNTER — Other Ambulatory Visit: Payer: Self-pay

## 2020-04-26 DIAGNOSIS — D0511 Intraductal carcinoma in situ of right breast: Secondary | ICD-10-CM | POA: Diagnosis not present

## 2020-04-27 ENCOUNTER — Ambulatory Visit (HOSPITAL_BASED_OUTPATIENT_CLINIC_OR_DEPARTMENT_OTHER)
Admission: RE | Admit: 2020-04-27 | Discharge: 2020-04-27 | Disposition: A | Discharge status: Home / Self Care | Payer: BC Managed Care – PPO | Attending: General Surgery | Admitting: General Surgery

## 2020-04-27 ENCOUNTER — Ambulatory Visit (HOSPITAL_BASED_OUTPATIENT_CLINIC_OR_DEPARTMENT_OTHER): Payer: BC Managed Care – PPO | Admitting: Anesthesiology

## 2020-04-27 ENCOUNTER — Encounter (HOSPITAL_BASED_OUTPATIENT_CLINIC_OR_DEPARTMENT_OTHER)
Admission: RE | Disposition: A | Discharge status: Home / Self Care | Payer: Self-pay | Source: Home / Self Care | Attending: General Surgery

## 2020-04-27 ENCOUNTER — Ambulatory Visit
Admission: RE | Admit: 2020-04-27 | Discharge: 2020-04-27 | Disposition: A | Discharge status: Home / Self Care | Payer: BC Managed Care – PPO | Source: Ambulatory Visit | Attending: General Surgery | Admitting: General Surgery

## 2020-04-27 ENCOUNTER — Other Ambulatory Visit: Payer: Self-pay

## 2020-04-27 ENCOUNTER — Encounter (HOSPITAL_BASED_OUTPATIENT_CLINIC_OR_DEPARTMENT_OTHER): Payer: Self-pay | Admitting: General Surgery

## 2020-04-27 DIAGNOSIS — Z170 Estrogen receptor positive status [ER+]: Secondary | ICD-10-CM | POA: Insufficient documentation

## 2020-04-27 DIAGNOSIS — Z880 Allergy status to penicillin: Secondary | ICD-10-CM | POA: Insufficient documentation

## 2020-04-27 DIAGNOSIS — N6489 Other specified disorders of breast: Secondary | ICD-10-CM | POA: Diagnosis not present

## 2020-04-27 DIAGNOSIS — N6012 Diffuse cystic mastopathy of left breast: Secondary | ICD-10-CM | POA: Insufficient documentation

## 2020-04-27 DIAGNOSIS — N6081 Other benign mammary dysplasias of right breast: Secondary | ICD-10-CM | POA: Diagnosis not present

## 2020-04-27 DIAGNOSIS — N6022 Fibroadenosis of left breast: Secondary | ICD-10-CM | POA: Diagnosis not present

## 2020-04-27 DIAGNOSIS — N6011 Diffuse cystic mastopathy of right breast: Secondary | ICD-10-CM | POA: Insufficient documentation

## 2020-04-27 DIAGNOSIS — Z79899 Other long term (current) drug therapy: Secondary | ICD-10-CM | POA: Insufficient documentation

## 2020-04-27 DIAGNOSIS — N641 Fat necrosis of breast: Secondary | ICD-10-CM | POA: Diagnosis not present

## 2020-04-27 DIAGNOSIS — Z882 Allergy status to sulfonamides status: Secondary | ICD-10-CM | POA: Diagnosis not present

## 2020-04-27 DIAGNOSIS — Z87891 Personal history of nicotine dependence: Secondary | ICD-10-CM | POA: Diagnosis not present

## 2020-04-27 DIAGNOSIS — D0511 Intraductal carcinoma in situ of right breast: Secondary | ICD-10-CM | POA: Diagnosis not present

## 2020-04-27 DIAGNOSIS — Z803 Family history of malignant neoplasm of breast: Secondary | ICD-10-CM | POA: Insufficient documentation

## 2020-04-27 DIAGNOSIS — N6082 Other benign mammary dysplasias of left breast: Secondary | ICD-10-CM | POA: Diagnosis not present

## 2020-04-27 DIAGNOSIS — E559 Vitamin D deficiency, unspecified: Secondary | ICD-10-CM | POA: Diagnosis not present

## 2020-04-27 DIAGNOSIS — R928 Other abnormal and inconclusive findings on diagnostic imaging of breast: Secondary | ICD-10-CM | POA: Diagnosis not present

## 2020-04-27 HISTORY — PX: BREAST LUMPECTOMY WITH RADIOACTIVE SEED LOCALIZATION: SHX6424

## 2020-04-27 HISTORY — PX: BREAST REDUCTION SURGERY: SHX8

## 2020-04-27 SURGERY — BREAST LUMPECTOMY WITH RADIOACTIVE SEED LOCALIZATION
Anesthesia: General | Site: Breast | Laterality: Right

## 2020-04-27 MED ORDER — ONDANSETRON HCL 4 MG/2ML IJ SOLN
INTRAMUSCULAR | Status: DC | PRN
  Administered 2020-04-27: 4 mg via INTRAVENOUS

## 2020-04-27 MED ORDER — MORPHINE SULFATE (PF) 4 MG/ML IV SOLN: 2.0000 mg | INTRAVENOUS | Status: DC | PRN

## 2020-04-27 MED ORDER — SODIUM CHLORIDE 0.9% FLUSH: 3.0000 mL | INTRAVENOUS | Status: DC | PRN

## 2020-04-27 MED ORDER — LIDOCAINE 2% (20 MG/ML) 5 ML SYRINGE
INTRAMUSCULAR | Status: AC
  Filled 2020-04-27: qty 5

## 2020-04-27 MED ORDER — LIDOCAINE-EPINEPHRINE (PF) 1 %-1:200000 IJ SOLN
INTRAMUSCULAR | Status: DC | PRN
  Administered 2020-04-27: 41 mL

## 2020-04-27 MED ORDER — VANCOMYCIN HCL IN DEXTROSE 1-5 GM/200ML-% IV SOLN
INTRAVENOUS | Status: AC
  Filled 2020-04-27: qty 200

## 2020-04-27 MED ORDER — LIDOCAINE HCL (PF) 1 % IJ SOLN
INTRAMUSCULAR | Status: AC
  Filled 2020-04-27: qty 60

## 2020-04-27 MED ORDER — HYDROCODONE-ACETAMINOPHEN 5-325 MG PO TABS: 1.0000 | ORAL_TABLET | Freq: Four times a day (QID) | ORAL | 0 refills | Status: DC | PRN

## 2020-04-27 MED ORDER — CHLORHEXIDINE GLUCONATE CLOTH 2 % EX PADS: 6.0000 | MEDICATED_PAD | Freq: Once | CUTANEOUS | Status: DC

## 2020-04-27 MED ORDER — MIDAZOLAM HCL 2 MG/2ML IJ SOLN
INTRAMUSCULAR | Status: DC | PRN
  Administered 2020-04-27: 2 mg via INTRAVENOUS

## 2020-04-27 MED ORDER — DEXAMETHASONE SODIUM PHOSPHATE 10 MG/ML IJ SOLN
INTRAMUSCULAR | Status: DC | PRN
  Administered 2020-04-27: 10 mg via INTRAVENOUS

## 2020-04-27 MED ORDER — BUPIVACAINE-EPINEPHRINE (PF) 0.25% -1:200000 IJ SOLN
INTRAMUSCULAR | Status: AC
  Filled 2020-04-27: qty 60

## 2020-04-27 MED ORDER — FENTANYL CITRATE (PF) 100 MCG/2ML IJ SOLN
INTRAMUSCULAR | Status: DC | PRN
  Administered 2020-04-27 (×3): 25 ug via INTRAVENOUS
  Administered 2020-04-27: 100 ug via INTRAVENOUS
  Administered 2020-04-27: 25 ug via INTRAVENOUS

## 2020-04-27 MED ORDER — ONDANSETRON HCL 4 MG/2ML IJ SOLN
INTRAMUSCULAR | Status: AC
  Filled 2020-04-27: qty 2

## 2020-04-27 MED ORDER — ACETAMINOPHEN 500 MG PO TABS
1000.0000 mg | ORAL_TABLET | ORAL | Status: AC
  Administered 2020-04-27: 1000 mg via ORAL

## 2020-04-27 MED ORDER — PROPOFOL 10 MG/ML IV BOLUS
INTRAVENOUS | Status: DC | PRN
  Administered 2020-04-27: 200 mg via INTRAVENOUS

## 2020-04-27 MED ORDER — FENTANYL CITRATE (PF) 100 MCG/2ML IJ SOLN
INTRAMUSCULAR | Status: AC
  Filled 2020-04-27: qty 2

## 2020-04-27 MED ORDER — CIPROFLOXACIN IN D5W 400 MG/200ML IV SOLN: 400.0000 mg | INTRAVENOUS | Status: DC

## 2020-04-27 MED ORDER — PHENYLEPHRINE 40 MCG/ML (10ML) SYRINGE FOR IV PUSH (FOR BLOOD PRESSURE SUPPORT)
PREFILLED_SYRINGE | INTRAVENOUS | Status: DC | PRN
  Administered 2020-04-27: 80 ug via INTRAVENOUS
  Administered 2020-04-27: 120 ug via INTRAVENOUS
  Administered 2020-04-27: 80 ug via INTRAVENOUS

## 2020-04-27 MED ORDER — DEXAMETHASONE SODIUM PHOSPHATE 10 MG/ML IJ SOLN
INTRAMUSCULAR | Status: AC
  Filled 2020-04-27: qty 1

## 2020-04-27 MED ORDER — ACETAMINOPHEN 500 MG PO TABS
ORAL_TABLET | ORAL | Status: AC
  Filled 2020-04-27: qty 2

## 2020-04-27 MED ORDER — OXYCODONE HCL 5 MG PO TABS
5.0000 mg | ORAL_TABLET | ORAL | Status: DC | PRN
  Administered 2020-04-27: 5 mg via ORAL

## 2020-04-27 MED ORDER — FENTANYL CITRATE (PF) 100 MCG/2ML IJ SOLN
25.0000 ug | INTRAMUSCULAR | Status: DC | PRN
  Administered 2020-04-27: 25 ug via INTRAVENOUS

## 2020-04-27 MED ORDER — PROMETHAZINE HCL 25 MG/ML IJ SOLN: 6.2500 mg | INTRAMUSCULAR | Status: DC | PRN

## 2020-04-27 MED ORDER — VANCOMYCIN HCL IN DEXTROSE 1-5 GM/200ML-% IV SOLN
1000.0000 mg | INTRAVENOUS | Status: AC
  Administered 2020-04-27: 1000 mg via INTRAVENOUS

## 2020-04-27 MED ORDER — SCOPOLAMINE 1 MG/3DAYS TD PT72
1.0000 | MEDICATED_PATCH | TRANSDERMAL | Status: DC
  Administered 2020-04-27: 1.5 mg via TRANSDERMAL

## 2020-04-27 MED ORDER — SCOPOLAMINE 1 MG/3DAYS TD PT72
MEDICATED_PATCH | TRANSDERMAL | Status: AC
  Filled 2020-04-27: qty 1

## 2020-04-27 MED ORDER — MIDAZOLAM HCL 2 MG/2ML IJ SOLN
INTRAMUSCULAR | Status: AC
  Filled 2020-04-27: qty 2

## 2020-04-27 MED ORDER — OXYCODONE HCL 5 MG PO TABS
ORAL_TABLET | ORAL | Status: AC
  Filled 2020-04-27: qty 1

## 2020-04-27 MED ORDER — GABAPENTIN 300 MG PO CAPS
ORAL_CAPSULE | ORAL | Status: AC
  Filled 2020-04-27: qty 1

## 2020-04-27 MED ORDER — SODIUM CHLORIDE 0.9% FLUSH: 3.0000 mL | Freq: Two times a day (BID) | INTRAVENOUS | Status: DC

## 2020-04-27 MED ORDER — GABAPENTIN 300 MG PO CAPS
300.0000 mg | ORAL_CAPSULE | ORAL | Status: AC
  Administered 2020-04-27: 300 mg via ORAL

## 2020-04-27 MED ORDER — ACETAMINOPHEN 325 MG RE SUPP: 650.0000 mg | RECTAL | Status: DC | PRN

## 2020-04-27 MED ORDER — LIDOCAINE 2% (20 MG/ML) 5 ML SYRINGE
INTRAMUSCULAR | Status: DC | PRN
  Administered 2020-04-27: 100 mg via INTRAVENOUS

## 2020-04-27 MED ORDER — LACTATED RINGERS IV SOLN: INTRAVENOUS | Status: DC

## 2020-04-27 MED ORDER — ACETAMINOPHEN 325 MG PO TABS: 650.0000 mg | ORAL_TABLET | ORAL | Status: DC | PRN

## 2020-04-27 MED ORDER — SODIUM CHLORIDE 0.9 % IV SOLN: 250.0000 mL | INTRAVENOUS | Status: DC | PRN

## 2020-04-27 SURGICAL SUPPLY — 87 items
ADH SKN CLS APL DERMABOND .7 (GAUZE/BANDAGES/DRESSINGS) ×4
APL PRP STRL LF DISP 70% ISPRP (MISCELLANEOUS) ×2
APPLIER CLIP 9.375 MED OPEN (MISCELLANEOUS)
APR CLP MED 9.3 20 MLT OPN (MISCELLANEOUS)
BAG DECANTER FOR FLEXI CONT (MISCELLANEOUS) ×3 IMPLANT
BINDER BREAST LRG (GAUZE/BANDAGES/DRESSINGS) IMPLANT
BINDER BREAST MEDIUM (GAUZE/BANDAGES/DRESSINGS) IMPLANT
BINDER BREAST XLRG (GAUZE/BANDAGES/DRESSINGS) IMPLANT
BINDER BREAST XXLRG (GAUZE/BANDAGES/DRESSINGS) IMPLANT
BIOPATCH RED 1 DISK 7.0 (GAUZE/BANDAGES/DRESSINGS) IMPLANT
BLADE HEX COATED 2.75 (ELECTRODE) ×3 IMPLANT
BLADE KNIFE PERSONA 10 (BLADE) ×6 IMPLANT
BLADE SURG 15 STRL LF DISP TIS (BLADE) ×2 IMPLANT
BLADE SURG 15 STRL SS (BLADE) ×3
CANISTER SUC SOCK COL 7IN (MISCELLANEOUS) ×3 IMPLANT
CANISTER SUCT 1200ML W/VALVE (MISCELLANEOUS) ×3 IMPLANT
CHLORAPREP W/TINT 26 (MISCELLANEOUS) ×3 IMPLANT
CLIP APPLIE 9.375 MED OPEN (MISCELLANEOUS) IMPLANT
COVER BACK TABLE 60X90IN (DRAPES) ×3 IMPLANT
COVER MAYO STAND STRL (DRAPES) ×6 IMPLANT
COVER PROBE W GEL 5X96 (DRAPES) ×3 IMPLANT
COVER WAND RF STERILE (DRAPES) IMPLANT
DECANTER SPIKE VIAL GLASS SM (MISCELLANEOUS) IMPLANT
DERMABOND ADVANCED (GAUZE/BANDAGES/DRESSINGS) ×2
DERMABOND ADVANCED .7 DNX12 (GAUZE/BANDAGES/DRESSINGS) ×4 IMPLANT
DRAIN CHANNEL 19F RND (DRAIN) IMPLANT
DRAPE LAPAROSCOPIC ABDOMINAL (DRAPES) ×3 IMPLANT
DRAPE UTILITY XL STRL (DRAPES) ×3 IMPLANT
DRSG OPSITE POSTOP 4X6 (GAUZE/BANDAGES/DRESSINGS) ×3 IMPLANT
DRSG PAD ABDOMINAL 8X10 ST (GAUZE/BANDAGES/DRESSINGS) ×6 IMPLANT
ELECT BLADE 4.0 EZ CLEAN MEGAD (MISCELLANEOUS) ×3
ELECT COATED BLADE 2.86 ST (ELECTRODE) ×3 IMPLANT
ELECT REM PT RETURN 9FT ADLT (ELECTROSURGICAL) ×3
ELECTRODE BLDE 4.0 EZ CLN MEGD (MISCELLANEOUS) IMPLANT
ELECTRODE REM PT RTRN 9FT ADLT (ELECTROSURGICAL) ×2 IMPLANT
EVACUATOR SILICONE 100CC (DRAIN) IMPLANT
GAUZE SPONGE 4X4 12PLY STRL LF (GAUZE/BANDAGES/DRESSINGS) IMPLANT
GLOVE BIO SURGEON STRL SZ 6.5 (GLOVE) ×9 IMPLANT
GLOVE BIO SURGEON STRL SZ7.5 (GLOVE) ×6 IMPLANT
GLOVE BIOGEL M STRL SZ7.5 (GLOVE) ×3 IMPLANT
GOWN STRL REUS W/ TWL LRG LVL3 (GOWN DISPOSABLE) ×6 IMPLANT
GOWN STRL REUS W/TWL LRG LVL3 (GOWN DISPOSABLE) ×9
ILLUMINATOR WAVEGUIDE N/F (MISCELLANEOUS) IMPLANT
KIT MARKER MARGIN INK (KITS) ×3 IMPLANT
LIGHT WAVEGUIDE WIDE FLAT (MISCELLANEOUS) IMPLANT
NDL FILTER BLUNT 18X1 1/2 (NEEDLE) ×2 IMPLANT
NDL HYPO 25X1 1.5 SAFETY (NEEDLE) ×2 IMPLANT
NDL SAFETY ECLIPSE 18X1.5 (NEEDLE) IMPLANT
NDL SPNL 18GX3.5 QUINCKE PK (NEEDLE) IMPLANT
NEEDLE FILTER BLUNT 18X 1/2SAF (NEEDLE) ×1
NEEDLE FILTER BLUNT 18X1 1/2 (NEEDLE) ×2 IMPLANT
NEEDLE HYPO 18GX1.5 SHARP (NEEDLE)
NEEDLE HYPO 25X1 1.5 SAFETY (NEEDLE) ×3 IMPLANT
NEEDLE SPNL 18GX3.5 QUINCKE PK (NEEDLE) IMPLANT
NS IRRIG 1000ML POUR BTL (IV SOLUTION) IMPLANT
PACK BASIN DAY SURGERY FS (CUSTOM PROCEDURE TRAY) ×3 IMPLANT
PAD ALCOHOL SWAB (MISCELLANEOUS) IMPLANT
PAD FOAM SILICONE BACKED (GAUZE/BANDAGES/DRESSINGS) IMPLANT
PENCIL SMOKE EVACUATOR (MISCELLANEOUS) ×3 IMPLANT
PIN SAFETY STERILE (MISCELLANEOUS) IMPLANT
SLEEVE SCD COMPRESS KNEE MED (MISCELLANEOUS) ×3 IMPLANT
SPONGE LAP 18X18 RF (DISPOSABLE) ×6 IMPLANT
STRIP SUTURE WOUND CLOSURE 1/2 (MISCELLANEOUS) ×6 IMPLANT
SUT MNCRL AB 4-0 PS2 18 (SUTURE) ×17 IMPLANT
SUT MON AB 3-0 SH 27 (SUTURE) ×12
SUT MON AB 3-0 SH27 (SUTURE) ×8 IMPLANT
SUT MON AB 4-0 PC3 18 (SUTURE) ×3 IMPLANT
SUT MON AB 5-0 PS2 18 (SUTURE) ×6 IMPLANT
SUT PDS 3-0 CT2 (SUTURE)
SUT PDS AB 2-0 CT2 27 (SUTURE) IMPLANT
SUT PDS II 3-0 CT2 27 ABS (SUTURE) IMPLANT
SUT SILK 2 0 SH (SUTURE) IMPLANT
SUT SILK 3 0 PS 1 (SUTURE) IMPLANT
SUT VICRYL 3-0 CR8 SH (SUTURE) ×3 IMPLANT
SYR 3ML 23GX1 SAFETY (SYRINGE) IMPLANT
SYR 50ML LL SCALE MARK (SYRINGE) IMPLANT
SYR BULB IRRIG 60ML STRL (SYRINGE) ×3 IMPLANT
SYR CONTROL 10ML LL (SYRINGE) ×3 IMPLANT
TAPE MEASURE VINYL STERILE (MISCELLANEOUS) IMPLANT
TOWEL GREEN STERILE FF (TOWEL DISPOSABLE) ×6 IMPLANT
TRAY DSU PREP LF (CUSTOM PROCEDURE TRAY) ×3 IMPLANT
TRAY FAXITRON CT DISP (TRAY / TRAY PROCEDURE) ×3 IMPLANT
TUBE CONNECTING 20X1/4 (TUBING) ×3 IMPLANT
TUBING INFILTRATION IT-10001 (TUBING) IMPLANT
TUBING SET GRADUATE ASPIR 12FT (MISCELLANEOUS) IMPLANT
UNDERPAD 30X36 HEAVY ABSORB (UNDERPADS AND DIAPERS) ×6 IMPLANT
YANKAUER SUCT BULB TIP NO VENT (SUCTIONS) ×3 IMPLANT

## 2020-04-27 NOTE — Op Note (Signed)
Op note:    DATE OF PROCEDURE: 04/27/2020  LOCATION: [facility]  SURGEON: Lyndee Leo Sanger Ronia Hazelett, DO  ASSISTANT: Roetta Sessions, PA  PREOPERATIVE DIAGNOSIS 1. Right Breast Cancer 2. Breast Asymmetry  POSTOPERATIVE DIAGNOSIS 1. Right Breast Cancer 2. Breast Asymmetry  PROCEDURES 1. Bilateral breast reduction.  Right reduction 75 g, Left reduction 85 g  COMPLICATIONS: None.  DRAINS: none  INDICATIONS FOR PROCEDURE Catherine Santos is a 49 y.o. year-old female born on [birth year],with right breast cancer undergoing a right partial mastectomy. MRN: [id number]  CONSENT Informed consent was obtained directly from the patient. The risks, benefits and alternatives were fully discussed. Specific risks including but not limited to bleeding, infection, hematoma, seroma, scarring, pain, nipple necrosis, asymmetry, poor cosmetic results, and need for further surgery were discussed. The patient had ample opportunity to have her questions answered to her satisfaction.  DESCRIPTION OF PROCEDURE  Patient was brought into the operating room and placed in a supine position.  SCDs were placed and appropriate padding was performed.  Antibiotics were given. The patient underwent general anesthesia and the chest was prepped and draped in a sterile fashion.  A timeout was performed and all information was confirmed to be correct.  Right side: Preoperative markings were confirmed.  Incision lines were injected with 1% Xylocaine with epinephrine.  General surgery performed a partial mastectomy involving the lateral and central portion of the breast.  Their portion of the case is documented in the chart. A Wise-pattern superomedial breast reduction was performed by de-epithelializing the pedicle, using bovie to create the superomedial pedicle, and removing breast tissue from the lateral and inferior portions of the breast.  Care was taken to not undermine the breast pedicle.  Hemostasis was achieved.  The nipple was gently raised into position and the soft tissue closed with 4-0 Monocryl.   The pocket was irrigated and hemostasis confirmed.  The deep tissues were approximated with 3-0 Monocryl sutures and the skin was closed with deep dermal and subcuticular 4-0 Monocryl sutures.  The nipple and skin flaps had good capillary refill at the end of the procedure.    Left side: Preoperative markings were confirmed.  Incision lines were injected with 1% Xylocaine with epinephrine.  After waiting for vasoconstriction, the marked lines were incised.  A Wise-pattern superomedial breast reduction was performed by de-epithelializing the pedicle, using bovie to create the superomedial pedicle, and removing breast tissue from the lateral and inferior portions of the breast.  Care was taken to not undermine the breast pedicle. Hemostasis was achieved.  The nipple was gently lifted into position and the soft tissue was closed with 4-0 Monocryl.  The patient was sat upright and size and shape symmetry was confirmed.  The pocket was irrigated and hemostasis confirmed.  The deep tissues were approximated with 3-0 Monocryl sutures and the skin was closed with deep dermal and subcuticular 4-0 Monocryl sutures.  Dermabond was applied.  A breast binder and ABDs were placed.  The nipple and skin flaps had good capillary refill at the end of the procedure.  The patient tolerated the procedure well. The patient was allowed to wake from anesthesia and taken to the recovery room in satisfactory condition.  The advanced practice practitioner (APP) assisted throughout the case.  The APP was essential in retraction and counter traction when needed to make the case progress smoothly.  This retraction and assistance made it possible to see the tissue plans for the procedure.  The assistance was needed for  blood control, tissue re-approximation and assisted with closure of the incision site.

## 2020-04-27 NOTE — Interval H&P Note (Signed)
History and Physical Interval Note:  04/27/2020 1:17 PM  ARNEISHA KINCANNON  has presented today for surgery, with the diagnosis of RIGHT BREAST DUCTAL CARCINOMA IN SITU.  The various methods of treatment have been discussed with the patient and family. After consideration of risks, benefits and other options for treatment, the patient has consented to  Procedure(s): RIGHT BREAST LUMPECTOMY WITH RADIOACTIVE SEED LOCALIZATION (Right) ONCOPLASTIC BREAST BILATERAL REDUCTION (Bilateral) as a surgical intervention.  The patient's history has been reviewed, patient examined, no change in status, stable for surgery.  I have reviewed the patient's chart and labs.  Questions were answered to the patient's satisfaction.     Autumn Messing III

## 2020-04-27 NOTE — Discharge Instructions (Signed)
INSTRUCTIONS FOR AFTER SURGERY   You will likely have some questions about what to expect following your operation.  The following information will help you and your family understand what to expect when you are discharged from the hospital.  Following these guidelines will help ensure a smooth recovery and reduce risks of complications.  Postoperative instructions include information on: diet, wound care, medications and physical activity.  AFTER SURGERY Expect to go home after the procedure.  In some cases, you may need to spend one night in the hospital for observation.  DIET This surgery does not require a specific diet.  However, I have to mention that the healthier you eat the better your body can start healing. It is important to increasing your protein intake.  This means limiting the foods with added sugar.  Focus on fruits and vegetables and some meat. It is very important to drink water after your surgery.  If your urine is bright yellow, then it is concentrated, and you need to drink more water.  As a general rule after surgery, you should have 8 ounces of water every hour while awake.  If you find you are persistently nauseated or unable to take in liquids let us know.  NO TOBACCO USE or EXPOSURE.  This will slow your healing process and increase the risk of a wound.  WOUND CARE If you don't have a drain: You can shower the day after surgery.  Use fragrance free soap.  Dial, [place], [place] and Cetaphil are usually mild on the skin.    If you have steri-strips / tape directly attached to your skin leave them in place. It is OK to get these wet.  No baths, pools or hot tubs for two weeks. We close your incision to leave the smallest and best-looking scar. No ointment or creams on your incisions until given the go ahead.  Especially not Neosporin (Too many skin reactions with this one).  A few weeks after surgery you can use Mederma and start massaging the scar. We ask you to wear your binder or  sports bra for the first 6 weeks around the clock, including while sleeping. This provides added comfort and helps reduce the fluid accumulation at the surgery site.  ACTIVITY No heavy lifting until cleared by the doctor.  It is OK to walk and climb stairs. In fact, moving your legs is very important to decrease your risk of a blood clot.  It will also help keep you from getting deconditioned.  Every 1 to 2 hours get up and walk for 5 minutes. This will help with a quicker recovery back to normal.  Let pain be your guide so you don't do too much.  NO, you cannot do the spring cleaning and don't plan on taking care of anyone else.  This is your time for TLC.   WORK Everyone returns to work at different times. As a rough guide, most people take at least 1 - 2 weeks off prior to returning to work. If you need documentation for your job, bring the forms to your postoperative follow up visit.  DRIVING Arrange for someone to bring you home from the hospital.  You may be able to drive a few days after surgery but not while taking any narcotics or valium.  BOWEL MOVEMENTS Constipation can occur after anesthesia and while taking pain medication.  It is important to stay ahead for your comfort.  We recommend taking Milk of Magnesia (2 tablespoons; twice a day)  while taking the pain pills.  SEROMA This is fluid your body tried to put in the surgical site.  This is normal but if it creates excessive pain and swelling let us know.  It usually decreases in a few weeks.  MEDICATIONS and PAIN CONTROL At your preoperative visit for you history and physical you were given the following medications: 1. An antibiotic: Start this medication when you get home and take according to the instructions on the bottle. 2. Zofran 4 mg:  This is to treat nausea and vomiting.  You can take this every 6 hours as needed and only if needed. 3. Norco (hydrocodone/acetaminophen) 5/325 mg:  This is only to be used after you have  taken the motrin or the tylenol. Every 8 hours as needed. Over the counter Medication to take: 4. Ibuprofen (Motrin) 600 mg:  Take this every 6 hours.  If you have additional pain then take 500 mg of the tylenol.  Only take the Norco after you have tried these two. 5. Miralax or stool softener of choice: Take this according to the bottle if you take the [place] Call your surgeon's office if any of the following occur:  Fever 101 degrees F or greater  Excessive bleeding or fluid from the incision site.  Pain that increases over time without aid from the medications  Redness, warmth, or pus draining from incision sites  Persistent nausea or inability to take in liquids  Severe misshapen area that underwent the operation.  Post Anesthesia Home Care Instructions  Activity: Get plenty of rest for the remainder of the day. A responsible individual must stay with you for 24 hours following the procedure.  For the next 24 hours, DO NOT: -Drive a car -Paediatric nurse -Drink alcoholic beverages -Take any medication unless instructed by your physician -Make any legal decisions or sign important papers.  Meals: Start with liquid foods such as gelatin or soup. Progress to regular foods as tolerated. Avoid greasy, spicy, heavy foods. If nausea and/or vomiting occur, drink only clear liquids until the nausea and/or vomiting subsides. Call your physician if vomiting continues.  Special Instructions/Symptoms: Your throat may feel dry or sore from the anesthesia or the breathing tube placed in your throat during surgery. If this causes discomfort, gargle with warm salt water. The discomfort should disappear within 24 hours.  If you had a scopolamine patch placed behind your ear for the management of post- operative nausea and/or vomiting:  1. The medication in the patch is effective for 72 hours, after which it should be removed.  Wrap patch in a tissue and discard in the trash.  Wash hands thoroughly with soap and water. 2. You may remove the patch earlier than 72 hours if you experience unpleasant side effects which may include dry mouth, dizziness or visual disturbances. 3. Avoid touching the patch. Wash your hands with soap and water after contact with the patch.    No Tylenol until 6:30 pm.

## 2020-04-27 NOTE — Anesthesia Procedure Notes (Signed)
Procedure Name: LMA Insertion Date/Time: 04/27/2020 1:40 PM Performed by: Genelle Bal, CRNA Pre-anesthesia Checklist: Patient identified, Emergency Drugs available, Suction available and Patient being monitored Patient Re-evaluated:Patient Re-evaluated prior to induction Oxygen Delivery Method: Circle system utilized Preoxygenation: Pre-oxygenation with 100% oxygen Induction Type: IV induction Ventilation: Mask ventilation without difficulty LMA: LMA inserted LMA Size: 4.0 Number of attempts: 1 Airway Equipment and Method: Bite block Placement Confirmation: positive ETCO2 Tube secured with: Tape Dental Injury: Teeth and Oropharynx as per pre-operative assessment

## 2020-04-27 NOTE — H&P (Signed)
Catherine Santos is an 49 y.o. female.   Chief Complaint: breast cancer HPI:The aThe patient is a 49 yrs old female here for treatment of a RIGHT breast ductal carcinoma in situ.  The mammogram was done in September and the biopsy.  The lesions are estrogen and progesterone positive.  She is 5 feet 5 inches tall and weighs 198 pounds.  Her preop bra size is a 36 C/D.  I is wanting to be around the same size.   Marland Kitchen Past Medical History:  Diagnosis Date  . Abnormal Pap smear   . Cancer [facility])    breast cancer  . Dog bite 04/2017  . Family history of breast cancer 03/23/2020  . Family history of prostate cancer 03/23/2020    Past Surgical History:  Procedure Laterality Date  . CYSTOSCOPY  12/30/2011   Procedure: CYSTOSCOPY;  Surgeon: Lyman Speller, MD;  Location: [facility];  Service: Gynecology;  Laterality: N/A;  . ROBOTIC ASSISTED LAP VAGINAL HYSTERECTOMY    . SALPINGOOPHORECTOMY  12/30/2011   Procedure: SALPINGO OOPHERECTOMY;  Surgeon: Lyman Speller, MD;  Location: [facility];  Service: Gynecology;  Laterality: Left;  . TUBAL LIGATION      Family History  Problem Relation Age of Onset  . Hypertension Mother   . Diabetes Mother   . Thyroid disease Mother   . Breast cancer Cousin        maternal cousin-1st , diag 53s  . Prostate cancer Maternal Uncle        dx unknown age  . Lung cancer Paternal Grandfather        dx 30s; smoking hx  . Cancer Maternal Uncle        unknown type; unknown age dx  . Colon cancer Maternal Uncle        dx 67s  . Cancer Cousin        maternal cousin; GYN cancer, unknown age dx   Social History:  reports that she has quit smoking. Her smoking use included cigarettes. She has never used smokeless tobacco. She reports current alcohol use of about 2.0 - 3.0 standard drinks of alcohol per week. She reports that she does not use drugs.  Allergies:  Allergies  Allergen Reactions  . Phentermine Palpitations  . Penicillins Other (See Comments)    Pt doesn't  remember reaction to PCN- was in childhood  . Sulfa Antibiotics Rash    Medications Prior to Admission  Medication Sig Dispense Refill  . CALCIUM PO Take by mouth.    . ELDERBERRY PO Take by mouth. + vitamin c, zinc    . Multiple Vitamin (MULTIVITAMIN PO) Take by mouth.    . ondansetron (ZOFRAN) 4 MG tablet Take 1 tablet (4 mg total) by mouth every 8 (eight) hours as needed for nausea or vomiting. 20 tablet 0    No results found for this or any previous visit (from the past 48 hour(s)). MM RT RADIOACTIVE SEED LOC MAMMO GUIDE  Result Date: 04/26/2020 CLINICAL DATA:  49 year old female with recently diagnosed right breast DCIS presents for preoperative radioactive seed localization prior to planned lumpectomy. The biopsy marking clip was noted to be located greater than 2 cm superior to the biopsied calcifications in the upper-outer right breast. EXAM: MAMMOGRAPHIC GUIDED RADIOACTIVE SEED LOCALIZATION OF THE RIGHT BREAST COMPARISON:  Previous exam(s). FINDINGS: Patient presents for radioactive seed localization prior to right breast lumpectomy. I met with the patient and we discussed the procedure of seed localization including benefits and alternatives.  We discussed the high likelihood of a successful procedure. We discussed the risks of the procedure including infection, bleeding, tissue injury and further surgery. We discussed the low dose of radioactivity involved in the procedure. Informed, written consent was given. The usual time-out protocol was performed immediately prior to the procedure. Using mammographic guidance, sterile technique, 1% lidocaine and an I-125 radioactive seed, the calcifications (which are located approximately 2.5 cm inferior to the biopsy marking clip) were localized using a lateral to medial approach. The follow-up mammogram images confirm the seed in the expected location and were marked for Dr. Marlou Starks. Faint calcifications are also seen approximately 2 cm lateral to the  biopsy marking clip and radioactive seed. Follow-up survey of the patient confirms presence of the radioactive seed. Order number of I-125 seed:  [id number]. Total activity:  5.885 millicuries reference Date: 03/23/2020 The patient tolerated the procedure well and was released from the [facility]. She was given instructions regarding seed removal. IMPRESSION: 1.  Radioactive seed localization right breast. 2. Faint calcifications are present/extend approximately 2 cm lateral to the biopsy marking clip and radioactive seed. This was discussed with Dr. Marlou Starks 04/26/2020 at 11:45 a.m. Electronically Signed   By: Everlean Alstrom M.D.   On: 04/26/2020 11:50    Review of Systems  Constitutional: Negative.   HENT: Negative.   Eyes: Negative.   Respiratory: Negative.   Cardiovascular: Negative.   Gastrointestinal: Negative.   Endocrine: Negative.   Genitourinary: Negative.   Musculoskeletal: Negative.   Hematological: Negative.   Psychiatric/Behavioral: Negative.     Blood pressure 132/89, pulse 87, temperature 97.9 F (36.6 C), temperature source Oral, resp. rate 16, height 5' 5"  (1.651 m), weight 91.3 kg, last menstrual period 11/17/2011, SpO2 100 %. Physical Exam Vitals and nursing note reviewed.  Constitutional:      Appearance: Normal appearance.  HENT:     Head: Normocephalic and atraumatic.  Cardiovascular:     Rate and Rhythm: Normal rate.     Pulses: Normal pulses.  Pulmonary:     Effort: Pulmonary effort is normal.  Musculoskeletal:     Cervical back: Normal range of motion.  Skin:    General: Skin is warm.     Capillary Refill: Capillary refill takes less than 2 seconds.  Neurological:     General: No focal deficit present.     Mental Status: She is alert and oriented to person, place, and time.  Psychiatric:        Mood and Affect: Mood normal.        Behavior: Behavior normal.      Assessment/Plan Plan for bilateral oncoplastic breast reduction as able per the  partial mastectomy.  The risk that can be encountered with breast reduction were discussed and include the following but not limited to these:  Breast asymmetry, fluid accumulation, firmness of the breast, inability to breast feed, loss of nipple or areola, skin loss, decrease or no nipple sensation, fat necrosis of the breast tissue, bleeding, infection, healing delay.  There are risks of anesthesia, changes to skin sensation and injury to nerves or blood vessels.  The muscle can be temporarily or permanently injured.  You may have an allergic reaction to tape, suture, glue, blood products which can result in skin discoloration, swelling, pain, skin lesions, poor healing.  Any of these can lead to the need for revisonal surgery or stage procedures.  A reduction has potential to interfere with diagnostic procedures.  Nipple or breast piercing can increase risks of  infection.  This procedure is best done when the breast is fully developed.  Changes in the breast will continue to occur over time.  Pregnancy can alter the outcomes of previous breast reduction surgery, weight gain and weigh loss can also effect the long term appearance.     [place], DO 04/27/2020, 12:42 PM

## 2020-04-27 NOTE — Op Note (Signed)
04/27/2020  2:06 PM  PATIENT:  Catherine Santos  49 y.o. female  PRE-OPERATIVE DIAGNOSIS:  RIGHT BREAST DUCTAL CARCINOMA IN SITU  POST-OPERATIVE DIAGNOSIS:  RIGHT BREAST DUCTAL CARCINOMA IN SITU  PROCEDURE:  Procedure(s): RIGHT BREAST LUMPECTOMY WITH RADIOACTIVE SEED LOCALIZATION (Right)  SURGEON:  Surgeon(s) and Role: Panel 1:    * Jovita Kussmaul, MD - Primary  PHYSICIAN ASSISTANT:   ASSISTANTS: Dr. Jamelle Rushing   ANESTHESIA:   local and general  EBL:  minimal   BLOOD ADMINISTERED:none  DRAINS: none   LOCAL MEDICATIONS USED:  MARCAINE     SPECIMEN:  Source of Specimen:  right breast tissue with additional medial margin  DISPOSITION OF SPECIMEN:  PATHOLOGY  COUNTS:  YES  TOURNIQUET:  * No tourniquets in log *  DICTATION: .Dragon Dictation   After informed consent was obtained the patient was brought to the operating room and placed in the supine position on the operating table.  After adequate induction of general anesthesia the patient's bilateral chest, breast, and axillary areas were prepped with Betadine and draped in usual sterile manner.  An appropriate timeout was performed.  Previously an I-125 seed was placed in the lateral aspect of the right breast to mark an area of ductal carcinoma in situ.  The neoprobe was set to I-125 in the area of radioactivity was readily identified.  The patient had elected to have a reduction lumpectomy.  The plastic surgeon marked the areas for her incisions.  We made an incision along the lateral aspect of one of her markings with a 15 blade knife closest to where the area of radioactivity was.  The incision was carried through the skin and subcutaneous tissue sharply with the Bovie electrocautery.  Dissection was then carried out laterally between the skin and subcutaneous tissue and breast tissue until we were well beyond the area of radioactivity.  The dissection was then carried deeply into the breast through the incision until we were  beyond the area of the radioactive seed.  The dissection was carried to the chest wall.  A wedge of breast tissue was then removed laterally while checking the area of radioactivity frequently.  Once this dissection was complete then we had removed a large lateral area from the right breast.  A specimen radiograph showed the clip and seed to be within the specimen and we were far lateral to the seed where a second area of calcifications were supposed to be.  The specimen was then marked with the appropriate paint colors.  An additional medial margin was removed based on the level of signal from the radioactive seed.  This was marked appropriately.  All of this tissue was sent to pathology for further evaluation.  Hemostasis was achieved using the Bovie electrocautery.  The patient tolerated this portion of the procedure well.  At the end of this portion all needle sponge and instrument counts were correct.  At this point the operation was turned over to Dr. Marla Roe for the reduction.  Her portion will be dictated separately.  PLAN OF CARE: Discharge to home after PACU  PATIENT DISPOSITION:  PACU - hemodynamically stable.   Delay start of Pharmacological VTE agent (>24hrs) due to surgical blood loss or risk of bleeding: not applicable

## 2020-04-27 NOTE — Transfer of Care (Signed)
Immediate Anesthesia Transfer of Care Note  Patient: Catherine Santos  Procedure(s) Performed: RIGHT BREAST LUMPECTOMY WITH RADIOACTIVE SEED LOCALIZATION (Right Breast) ONCOPLASTIC BREAST BILATERAL REDUCTION (Bilateral Breast)  Patient Location: PACU  Anesthesia Type:General  Level of Consciousness: awake, alert  and oriented  Airway & Oxygen Therapy: Patient Spontanous Breathing and Patient connected to face mask oxygen  Post-op Assessment: Report given to RN and Post -op Vital signs reviewed and stable  Post vital signs: Reviewed and stable  Last Vitals:  Vitals Value Taken Time  BP 125/79 04/27/20 1518  Temp    Pulse 90 04/27/20 1519  Resp 14 04/27/20 1519  SpO2 100 % 04/27/20 1519  Vitals shown include unvalidated device data.  Last Pain:  Vitals:   04/27/20 1214  TempSrc: Oral  PainSc: 0-No pain         Complications: No complications documented.

## 2020-04-27 NOTE — Anesthesia Postprocedure Evaluation (Signed)
Anesthesia Post Note  Patient: Catherine Santos  Procedure(s) Performed: RIGHT BREAST LUMPECTOMY WITH RADIOACTIVE SEED LOCALIZATION (Right Breast) ONCOPLASTIC BREAST BILATERAL REDUCTION (Bilateral Breast)     Patient location during evaluation: PACU Anesthesia Type: General Level of consciousness: sedated Pain management: pain level controlled Vital Signs Assessment: post-procedure vital signs reviewed and stable Respiratory status: spontaneous breathing and respiratory function stable Cardiovascular status: stable Postop Assessment: no apparent nausea or vomiting Anesthetic complications: no   No complications documented.  Last Vitals:  Vitals:   04/27/20 1537 04/27/20 1545  BP:  122/88  Pulse: 90 85  Resp: 13 15  Temp:    SpO2: 99% 97%    Last Pain:  Vitals:   04/27/20 1537  TempSrc:   PainSc: 5                  Jaleal Schliep DANIEL

## 2020-04-27 NOTE — Anesthesia Preprocedure Evaluation (Addendum)
Anesthesia Evaluation  Patient identified by MRN, date of birth, ID band Patient awake    Reviewed: Allergy & Precautions, NPO status , Patient's Chart, lab work & pertinent test results  History of Anesthesia Complications Negative for: history of anesthetic complications  Airway Mallampati: II  TM Distance: >3 FB Neck ROM: Full    Dental no notable dental hx. (+) Dental Advisory Given   Pulmonary former smoker,    Pulmonary exam normal        Cardiovascular negative cardio ROS Normal cardiovascular exam     Neuro/Psych negative neurological ROS     GI/Hepatic negative GI ROS, Neg liver ROS,   Endo/Other  negative endocrine ROS  Renal/GU negative Renal ROS     Musculoskeletal negative musculoskeletal ROS (+)   Abdominal   Peds  Hematology negative hematology ROS (+)   Anesthesia Other Findings   Reproductive/Obstetrics                            Anesthesia Physical Anesthesia Plan  ASA: II  Anesthesia Plan: General   Post-op Pain Management:    Induction: Intravenous  PONV Risk Score and Plan: 4 or greater and Ondansetron, Dexamethasone, Midazolam and Scopolamine patch - Pre-op  Airway Management Planned: Oral ETT  Additional Equipment:   Intra-op Plan:   Post-operative Plan: Extubation in OR  Informed Consent: I have reviewed the patients History and Physical, chart, labs and discussed the procedure including the risks, benefits and alternatives for the proposed anesthesia with the patient or authorized representative who has indicated his/her understanding and acceptance.     Dental advisory given  Plan Discussed with: Anesthesiologist and CRNA  Anesthesia Plan Comments:        Anesthesia Quick Evaluation

## 2020-04-27 NOTE — H&P (Signed)
Catherine Santos  Location: [facility] Patient #: [id number] DOB: [birth year] Single / Language: Catherine Santos / Race: White Female   History of Present Illness  The patient is a 49 year old female who presents with breast cancer. We are asked to see the patient in consultation by Dr. Hale Bogus to evaluate her for a new right breast cancer. The patient is a 49 year old white female who recently went for a routine screening mammogram. At that time she was found to have a 9 mm area of microcalcification in the lateral aspect of the right breast that looked abnormal. 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 She denies any breast pain or discharge from the nipple. She does have one cousin that has a history of breast cancer. She does not smoke. She is otherwise in good health.   Past Surgical History  Breast Biopsy  Right. Hysterectomy (not due to cancer) - Complete   Diagnostic Studies History  Colonoscopy  never Mammogram  within last year Pap Smear  1-5 years ago  Allergies Sulfa Drugs  Penicillins  Allergies Reconciled   Medication History  Diethylpropion HCl (25MG Tablet, Oral) Active. Medications Reconciled  Social History Alcohol use  Occasional alcohol use. Caffeine use  Carbonated beverages, Tea. No drug use  Tobacco use  Former smoker.  Family History Arthritis  Mother. Breast Cancer  Family Members In General. Colon Cancer  Family Members In General. Diabetes Mellitus  Mother. Thyroid problems  Mother.  Pregnancy / Birth History  Age at menarche  89 years. Gravida  1 Maternal age  52-20 Para  1  Other Problems  Breast Cancer     Review of Systems General Not Present- Appetite Loss, Chills, Fatigue, Fever, Night Sweats, Weight Gain and Weight Loss. Skin Not Present- Change in Wart/Mole, Dryness, Hives, Jaundice, New Lesions, Non-Healing Wounds, Rash and Ulcer. HEENT Not  Present- Earache, Hearing Loss, Hoarseness, Nose Bleed, Oral Ulcers, Ringing in the Ears, Seasonal Allergies, Sinus Pain, Sore Throat, Visual Disturbances, Wears glasses/contact lenses and Yellow Eyes. Respiratory Not Present- Bloody sputum, Chronic Cough, Difficulty Breathing, Snoring and Wheezing. Breast Present- Breast Mass. Not Present- Breast Pain, Nipple Discharge and Skin Changes. Cardiovascular Not Present- Chest Pain, Difficulty Breathing Lying Down, Leg Cramps, Palpitations, Rapid Heart Rate, Shortness of Breath and Swelling of Extremities. Gastrointestinal Not Present- Abdominal Pain, Bloating, Bloody Stool, Change in Bowel Habits, Chronic diarrhea, Constipation, Difficulty Swallowing, Excessive gas, Gets full quickly at meals, Hemorrhoids, Indigestion, Nausea, Rectal Pain and Vomiting. Female Genitourinary Not Present- Frequency, Nocturia, Painful Urination, Pelvic Pain and Urgency. Musculoskeletal Not Present- Back Pain, Joint Pain, Joint Stiffness, Muscle Pain, Muscle Weakness and Swelling of Extremities. Neurological Not Present- Decreased Memory, Fainting, Headaches, Numbness, Seizures, Tingling, Tremor, Trouble walking and Weakness. Psychiatric Not Present- Anxiety, Bipolar, Change in Sleep Pattern, Depression, Fearful and Frequent crying. Endocrine Not Present- Cold Intolerance, Excessive Hunger, Hair Changes, Heat Intolerance, Hot flashes and New Diabetes. Hematology Not Present- Blood Thinners, Easy Bruising, Excessive bleeding, Gland problems, HIV and Persistent Infections.  Vitals [name] CMA; 03/16/2020 4:02 PM) 03/16/2020 4:02 PM Weight: 201 lb Height: 66in Body Surface Area: 2 m Body Mass Index: 32.44 kg/m  Temp.: 98.66F  Pulse: 101 (Regular)  BP: 144/100(Sitting, Left Arm, Standard)       Physical Exam General Mental Status-Alert. General Appearance-Consistent with stated age. Hydration-Well hydrated. Voice-Normal.  Head and  Neck Head-normocephalic, atraumatic with no lesions or palpable masses. Trachea-midline. Thyroid Gland Characteristics -  normal size and consistency.  Eye Eyeball - Bilateral-Extraocular movements intact. Sclera/Conjunctiva - Bilateral-No scleral icterus.  Chest and Lung Exam Chest and lung exam reveals -quiet, even and easy respiratory effort with no use of accessory muscles and on auscultation, normal breath sounds, no adventitious sounds and normal vocal resonance. Inspection Chest Wall - Normal. Back - normal.  Breast Note: There is no palpable mass in either breast. There is no palpable axillary, supraclavicular, or cervical lymphadenopathy.   Cardiovascular Cardiovascular examination reveals -normal heart sounds, regular rate and rhythm with no murmurs and normal pedal pulses bilaterally.  Abdomen Inspection Inspection of the abdomen reveals - No Hernias. Skin - Scar - no surgical scars. Palpation/Percussion Palpation and Percussion of the abdomen reveal - Soft, Non Tender, No Rebound tenderness, No Rigidity (guarding) and No hepatosplenomegaly. Auscultation Auscultation of the abdomen reveals - Bowel sounds normal.  Neurologic Neurologic evaluation reveals -alert and oriented x 3 with no impairment of recent or remote memory. Mental Status-Normal.  Musculoskeletal Normal Exam - Left-Upper Extremity Strength Normal and Lower Extremity Strength Normal. Normal Exam - Right-Upper Extremity Strength Normal and Lower Extremity Strength Normal.  Lymphatic Head & Neck  General Head & Neck Lymphatics: Bilateral - Description - Normal. Axillary  General Axillary Region: Bilateral - Description - Normal. Tenderness - Non Tender. Femoral & Inguinal  Generalized Femoral & Inguinal Lymphatics: Bilateral - Description - Normal. Tenderness - Non Tender.    Assessment & Plan  DUCTAL CARCINOMA IN SITU (DCIS) OF RIGHT BREAST (D05.11) Impression: The  patient appears to have a 9 mm area of ductal carcinoma in situ in the outer aspect of the right breast. We have discussed in detail the options for treatment. Given her young age she may be a candidate for genetic testing. If her genetics are negative then she would favor breast conservation which I think would be very reasonable. If her genetics were positive then she would consider bilateral mastectomies with reconstruction. I'll refer her for genetics evaluation. I will also refer her to medical and radiation oncology to discuss adjuvant therapy. I will also refer her to plastic surgery to talk about reconstructive options while we were waiting for the rest of her information. I will also contact the breast center to confirm whether a new clip needs to be placed since her clip migrated from the biopsy area. I have discussed with her in detail the risks and benefits of the operations as well as some of the technical aspects including the use of radioactive seeds for localization and she understands and wishes to proceed. This patient encounter took 60 minutes today to perform the following: take history, perform exam, review outside records, interpret imaging, counsel the patient on their diagnosis and document encounter, findings & plan in the EHR Current Plans Referred to Oncology, for evaluation and follow up (Oncology). Routine. Referred to Genetic Counseling, for evaluation and follow up [employer]). Routine. Referred to Surgery - Plastic, for evaluation and follow up (Plastic Surgery). Routine.  She has elected for right reduction lumpectomy

## 2020-04-30 ENCOUNTER — Encounter (HOSPITAL_BASED_OUTPATIENT_CLINIC_OR_DEPARTMENT_OTHER): Payer: Self-pay | Admitting: General Surgery

## 2020-04-30 LAB — SURGICAL PATHOLOGY

## 2020-05-01 ENCOUNTER — Encounter: Payer: Self-pay | Admitting: *Deleted

## 2020-05-03 NOTE — Progress Notes (Signed)
Patient is a 49 year old female here for follow-up after right breast lumpectomy with radioactive seed localization by Dr. Marlou Starks and bilateral oncoplastic breast reduction by Dr. Marla Roe on 04/27/2020.  Patient reports she is doing well.  She is struggling a little bit with the size change in her breast, but she is overall very happy with the recovery.  She reports that pain has been very well controlled.  She had to take some ibuprofen on Sunday but is otherwise doing great.  Chaperone present on exam. Bilateral breast incisions intact, Steri-Strips and honeycomb dressings in place.  Bilateral NAC's are viable with good color.  No necrosis noted.  Bilateral breasts breasts are symmetric.  No fluid wave noted with palpation.  Some bruising noted bilaterally, this appears to be resolving.  Recommend continue her compressive garment 24/7 until 6 weeks postop Recommend continuing to avoid strenuous activity for 6 weeks. Recommend calling with any questions or concerns, follow-up in 1 week for reevaluation.

## 2020-05-04 ENCOUNTER — Ambulatory Visit (INDEPENDENT_AMBULATORY_CARE_PROVIDER_SITE_OTHER): Payer: BC Managed Care – PPO | Admitting: Surgical

## 2020-05-04 ENCOUNTER — Other Ambulatory Visit: Payer: Self-pay

## 2020-05-04 ENCOUNTER — Ambulatory Visit: Payer: BC Managed Care – PPO | Admitting: Hematology and Oncology

## 2020-05-04 ENCOUNTER — Encounter: Payer: Self-pay | Admitting: Surgical

## 2020-05-04 VITALS — BP 128/85 | HR 92 | Temp 98.5°F

## 2020-05-04 DIAGNOSIS — D0511 Intraductal carcinoma in situ of right breast: Secondary | ICD-10-CM

## 2020-05-10 NOTE — Progress Notes (Signed)
[place]  [phone] Fax:([phone]) [phone]     ID: Catherine Santos DOB: 1970/09/15  MR#: [id number]  QBH#:[id number]  Patient Care Team: Willeen Niece, PA as PCP - General (Physician Assistant) Mauro Kaufmann, RN as Oncology Nurse Navigator Rockwell Germany, RN as Oncology Nurse Navigator Tywan Siever, Virgie Dad, MD as Consulting Physician (Oncology) Jovita Kussmaul, MD as Consulting Physician (General Surgery) Eppie Gibson, MD as Attending Physician (Radiation Oncology) Megan Salon, MD as Consulting Physician (Gynecology) Dillingham, Loel Lofty, DO as Attending Physician (Plastic Surgery) Chauncey Cruel, MD OTHER MD:  CHIEF COMPLAINT: noninvasive breast cancer, estrogen receptor positive  CURRENT TREATMENT: Adjuvant radiation pending   HISTORY OF CURRENT ILLNESS: From Dr. Geralyn Flash original intake note:   "Catherine Santos 49 y.o. female is here because of recent diagnosis of right breast ductal carcinoma in situ. Screening mammogram on 02/08/20 detected right breast calcifications. Diagnostic mammogram on 02/23/20 showed a 0.9cm group of right breast calcifications. 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 Marlou Starks. Pathology from the procedure [phone]) showed: ductal carcinoma in situ, high grade, with necrosis; margins negative.  The patient's subsequent history is as detailed below.   INTERVAL HISTORY: Catherine Santos returns today for follow up of her noninvasive breast cancer and to establish care with me. She was evaluated by Dr. Lindi Adie in the breast cancer clinic on 03/26/2020.   Genetic counseling performed on 03/22/2020 was negative with a variant of uncertain significance in [facility].  She is scheduled to meet back with Dr. Isidore Moos on 05/25/2020 to further discuss radiation therapy.   REVIEW OF SYSTEMS: Catherine Santos is very pleased with the cosmetic result of her  surgery.  She is having some increased sensitivity in the medial aspect of the right breast.  She does not describe this as a pain and she is not taking pain medicine for this.  She is a little limited in what she is able to do but she is just been told she can walk and she used to walk at least 3 miles at least 3 days a week with friends and that is her plan.  Currently she denies unusual headaches, visual changes, nausea, vomiting, stiff neck, dizziness, or gait imbalance. There has been no cough, phlegm production, or pleurisy, no chest pain or pressure, and no change in bowel or bladder habits. The patient denies fever, rash, bleeding, unexplained fatigue or unexplained weight loss. A detailed review of systems was otherwise entirely negative.   COVID 19 VACCINATION STATUS: s/p Pfizer x 2, most recently April 2021   PAST MEDICAL HISTORY: Past Medical History:  Diagnosis Date  . Abnormal Pap smear   . Cancer [facility])    breast cancer  . Dog bite 04/2017  . Family history of breast cancer 03/23/2020  . Family history of prostate cancer 03/23/2020    PAST SURGICAL HISTORY: Past Surgical History:  Procedure Laterality Date  . BREAST LUMPECTOMY WITH RADIOACTIVE SEED LOCALIZATION Right 04/27/2020   Procedure: RIGHT BREAST LUMPECTOMY WITH RADIOACTIVE SEED LOCALIZATION;  Surgeon: Jovita Kussmaul, MD;  Location: [facility];  Service: General;  Laterality: Right;  . BREAST REDUCTION SURGERY Bilateral 04/27/2020   Procedure: ONCOPLASTIC BREAST BILATERAL REDUCTION;  Surgeon: Wallace Going, DO;  Location: [facility];  Service: Plastics;  Laterality: Bilateral;  . CYSTOSCOPY  12/30/2011   Procedure: CYSTOSCOPY;  Surgeon: Lyman Speller, MD;  Location:  [facility];  Service: Gynecology;  Laterality: N/A;  . ROBOTIC ASSISTED LAP VAGINAL HYSTERECTOMY    . SALPINGOOPHORECTOMY  12/30/2011   Procedure: SALPINGO OOPHERECTOMY;  Surgeon: Lyman Speller, MD;  Location: [facility];   Service: Gynecology;  Laterality: Left;  . TUBAL LIGATION      FAMILY HISTORY: Family History  Problem Relation Age of Onset  . Hypertension Mother   . Diabetes Mother   . Thyroid disease Mother   . Breast cancer Cousin        maternal cousin-1st , diag 80s  . Prostate cancer Maternal Uncle        dx unknown age  . Lung cancer Paternal Grandfather        dx 43s; smoking hx  . Cancer Maternal Uncle        unknown type; unknown age dx  . Colon cancer Maternal Uncle        dx 88s  . Cancer Cousin        maternal cousin; GYN cancer, unknown age dx  As of November 2021 the patient's mother is 57 years old with no cancer history.  A maternal uncle had prostate cancer and another colon cancer.  A third had an unknown type of cancer.  A maternal cousin had breast cancer in her 15s and a second maternal cousin had an unknown gynecologic cancer.  The patient's father is 47 years old as of November 2021.  The paternal grandfather died from lung cancer at an advanced age.  There is no other family history of cancer to the patient's knowledge.   GYNECOLOGIC HISTORY:  Patient's last menstrual period was 11/17/2011. Menarche: 49 years old Age at first live birth: 49 years old [place] P 1 LMP status post hysterectomy Contraceptive remotely, without complications HRT N/A  Hysterectomy? yes BSO? 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 She is single.  [name], 66 years old, is a Engineer, building services and his wife is in medical school in [place] where her son is planning to move soon.  The patient has no grandchildren.  She lives by herself with two boxers.  She is not a church attender    ADVANCED DIRECTIVES: Not in place.  She tells me she intends to name [name] as healthcare power of attorney.   HEALTH MAINTENANCE: Social History   Tobacco Use  . Smoking status: Former Smoker    Types: Cigarettes  .  Smokeless tobacco: Never Used  Vaping Use  . Vaping Use: Never used  Substance Use Topics  . Alcohol use: Yes    Alcohol/week: 2.0 - 3.0 standard drinks    Types: 2 - 3 Standard drinks or equivalent per week    Comment: on weekends  . Drug use: No     Colonoscopy: Cologard 01/2020 negative (performed at [facility])  PAP:  (s/p hysterectomy)  Bone density: n/a (age)   Allergies  Allergen Reactions  . Phentermine Palpitations  . Penicillins Other (See Comments)    Pt doesn't remember reaction to PCN- was in childhood  . Sulfa Antibiotics Rash    Current Outpatient Medications  Medication Sig Dispense Refill  . CALCIUM PO Take by mouth.    . ELDERBERRY PO Take by mouth. + vitamin c, zinc    . Multiple Vitamin (MULTIVITAMIN PO) Take by mouth.     No current facility-administered medications for this visit.    OBJECTIVE: White  woman who appears well  Vitals:   05/11/20 0822  BP: 124/88  Pulse: 83  Resp: 18  Temp: (!) 96.3 F (35.7 C)  SpO2: 97%     Body mass index is 33.7 kg/m.   Wt Readings from Last 3 Encounters:  05/11/20 202 lb 8 oz (91.9 kg)  04/27/20 201 lb 4.5 oz (91.3 kg)  04/19/20 202 lb 9.6 oz (91.9 kg)      ECOG FS:1 - Symptomatic but completely ambulatory  Ocular: Sclerae unicteric, pupils round and equal Ear-nose-throat: Wearing a mask Lymphatic: No cervical or supraclavicular adenopathy Lungs no rales or rhonchi Heart regular rate and rhythm Abd soft, nontender, positive bowel sounds MSK no focal spinal tenderness, no joint edema Neuro: non-focal, well-oriented, appropriate affect Breasts: Status post right lumpectomy and bilateral reduction mammoplasty.  The overall cosmetic result is excellent with very Santos symmetry.  The area of increased sensitivity in the upper medial quadrant of the right breast is unremarkable to inspection and palpation.  Both axillae are benign.   LAB RESULTS:  CMP     Component Value Date/Time   NA 137 02/10/2019 0920    K 4.4 02/10/2019 0920   CL 101 02/10/2019 0920   CO2 20 02/10/2019 0920   GLUCOSE 96 02/10/2019 0920   GLUCOSE 106 (H) 08/05/2016 1422   BUN 13 02/10/2019 0920   CREATININE 0.71 02/10/2019 0920   CREATININE 0.83 08/05/2016 1422   CALCIUM 9.4 02/10/2019 0920   PROT 7.2 02/10/2019 0920   ALBUMIN 4.7 02/10/2019 0920   AST 18 02/10/2019 0920   ALT 17 02/10/2019 0920   ALKPHOS 65 02/10/2019 0920   BILITOT 0.3 02/10/2019 0920   GFRNONAA 102 02/10/2019 0920   GFRAA 117 02/10/2019 0920    No results found for: TOTALPROTELP, ALBUMINELP, A1GS, A2GS, BETS, BETA2SER, GAMS, MSPIKE, SPEI  Lab Results  Component Value Date   WBC 6.5 02/10/2019   NEUTROABS 4.6 11/03/2017   HGB 13.7 02/10/2019   HCT 41.1 02/10/2019   MCV 91 02/10/2019   PLT 306 02/10/2019    No results found for: LABCA2  No components found for: GGYIRS854  No results for input(s): INR in the last 168 hours.  No results found for: LABCA2  No results found for: OEV035  No results found for: KKX381  No results found for: WEX937  No results found for: CA2729  No components found for: HGQUANT  No results found for: CEA1 / No results found for: CEA1   No results found for: AFPTUMOR  No results found for: CHROMOGRNA  No results found for: KPAFRELGTCHN, LAMBDASER, KAPLAMBRATIO (kappa/lambda light chains)  No results found for: HGBA, HGBA2QUANT, HGBFQUANT, HGBSQUAN (Hemoglobinopathy evaluation)   No results found for: LDH  No results found for: IRON, TIBC, IRONPCTSAT (Iron and TIBC)  No results found for: FERRITIN  Urinalysis    Component Value Date/Time   BILIRUBINUR N 11/03/2017 1505   PROTEINUR N 11/03/2017 1505   UROBILINOGEN 0.2 11/03/2017 1505   NITRITE N 11/03/2017 1505   LEUKOCYTESUR Small (1+) (A) 11/03/2017 1505    STUDIES: MM Breast Surgical Specimen  Result Date: 04/27/2020 CLINICAL DATA:  Status post RIGHT breast lumpectomy today after earlier radioactive seed localization. EXAM:  SPECIMEN RADIOGRAPH OF THE RIGHT BREAST COMPARISON:  Previous exam(s). FINDINGS: Status post excision of the right breast. The radioactive seed and biopsy marker clip are present, completely intact, and were marked for pathology. Findings discussed with the OR staff during the procedure. IMPRESSION: Specimen radiograph of the  right breast. Electronically Signed   By: Franki Cabot M.D.   On: 04/27/2020 14:06   MM RT RADIOACTIVE SEED LOC MAMMO GUIDE  Result Date: 04/26/2020 CLINICAL DATA:  49 year old female with recently diagnosed right breast DCIS presents for preoperative radioactive seed localization prior to planned lumpectomy. The biopsy marking clip was noted to be located greater than 2 cm superior to the biopsied calcifications in the upper-outer right breast. EXAM: MAMMOGRAPHIC GUIDED RADIOACTIVE SEED LOCALIZATION OF THE RIGHT BREAST COMPARISON:  Previous exam(s). FINDINGS: Patient presents for radioactive seed localization prior to right breast lumpectomy. I met with the patient and we discussed the procedure of seed localization including benefits and alternatives. We discussed the high likelihood of a successful procedure. We discussed the risks of the procedure including infection, bleeding, tissue injury and further surgery. We discussed the low dose of radioactivity involved in the procedure. Informed, written consent was given. The usual time-out protocol was performed immediately prior to the procedure. Using mammographic guidance, sterile technique, 1% lidocaine and an I-125 radioactive seed, the calcifications (which are located approximately 2.5 cm inferior to the biopsy marking clip) were localized using a lateral to medial approach. The follow-up mammogram images confirm the seed in the expected location and were marked for Dr. Marlou Starks. Faint calcifications are also seen approximately 2 cm lateral to the biopsy marking clip and radioactive seed. Follow-up survey of the patient confirms  presence of the radioactive seed. Order number of I-125 seed:  [id number]. Total activity:  6.440 millicuries reference Date: 03/23/2020 The patient tolerated the procedure well and was released from the [facility]. She was given instructions regarding seed removal. IMPRESSION: 1.  Radioactive seed localization right breast. 2. Faint calcifications are present/extend approximately 2 cm lateral to the biopsy marking clip and radioactive seed. This was discussed with Dr. Marlou Starks 04/26/2020 at 11:45 a.m. Electronically Signed   By: Everlean Alstrom M.D.   On: 04/26/2020 11:50     ELIGIBLE FOR AVAILABLE RESEARCH PROTOCOL: AET  ASSESSMENT: 49 y.o. [place] woman status post right breast biopsy 03/07/2020 for ductal carcinoma in situ, intermediate to high-grade, estrogen and progesterone receptor positive.  (1) genetics testing 03/28/2020 through the [facility] Common Hereditary Cancers Panel found no deleterious mutations in APC, ATM, AXIN2, BARD1, BMPR1A, BRCA1, BRCA2, BRIP1, CDH1, CDK4, CDKN2A (p14ARF), CDKN2A (p16INK4a), CHEK2, CTNNA1, DICER1, EPCAM (Deletion/duplication testing only), GREM1 (promoter region deletion/duplication testing only), KIT, MEN1, MLH1, MSH2, MSH3, MSH6, MUTYH, NBN, NF1, NHTL1, PALB2, PDGFRA, PMS2, POLD1, POLE, PTEN, RAD50, RAD51C, RAD51D, RNF43, SDHB, SDHC, SDHD, SMAD4, SMARCA4. STK11, TP53, TSC1, TSC2, and VHL.  The following genes were evaluated for sequence changes only: SDHA and HOXB13 c.251G>A variant only.  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  (2) status post right lumpectomy 04/27/2020 for ductal carcinoma in situ, high-grade, with negative margins.  (a) status post bilateral breast reduction 04/27/2020  (3) adjuvant radiation pending  (4) to start tamoxifen 07/24/2020  PLAN: I met today with Vora to review her new diagnosis. Specifically we discussed the biology of her breast cancer, its diagnosis, staging, treatment  options and  prognosis. Caty understands that in noninvasive ductal carcinoma, also called ductal carcinoma in situ ("DCIS") the breast cancer cells remain trapped in the ducts were they started. They cannot travel to a vital organ. For that reason these cancers in themselves are not life-threatening.  Since the patient kept her breast, there will be some risk of recurrence. The recurrence can only be in the same breast since, again, the  cells are trapped in the ducts. There is no connection from one breast to the other. The risk of local recurrence is cut by more than half with radiation, which is standard in this situation.  In estrogen receptor positive cancers like Catherine Santos's, anti-estrogens can also be considered. They will further reduce the risk of recurrence by one half. In addition anti-estrogens will lower the risk of a new breast cancer developing in either breast, also by one half. That risk otherwise approaches 1% per year.   While the benefit in terms of local recurrence of this cancer is very small, 5% risk reduction or less, the benefit in terms of risk reduction of another cancer developing in the future is significant.  Accordingly I would recommend tamoxifen for prophylaxis even more than for treatment.  Today we discussed the mechanism of action of tamoxifen and its possible benefits as well as the possible toxicities side effects and complications.  She is interested in proceeding with that.  She will likely be undergoing radiation into early January.  I think it would be best to start tamoxifen July 24, 2020 and that is the plan  She will then return to see me late March 2022 to make sure she is tolerating tamoxifen well and if so I will set her up for mammography and yearly visits thereafter  Zhanae has a Santos understanding of the overall plan. She agrees with it. She knows the goal of treatment in her case is cure. She will call with any problems that may develop before her next visit  here.  Total encounter time 45 minutes.Sarajane Jews C. Kaimana Neuzil, MD 05/11/2020 8:27 AM Medical Oncology and Hematology [facility] [place], [place] [address] Tel. [phone]    Fax. [phone]   This document serves as a record of services personally performed by Lurline Del, MD. It was created on his behalf by Wilburn Mylar, a trained medical scribe. The creation of this record is based on the scribe's personal observations and the provider's statements to them.   I, Lurline Del MD, have reviewed the above documentation for accuracy and completeness, and I agree with the above.    *Total Encounter Time as defined by the Centers for Medicare and Medicaid Services includes, in addition to the face-to-face time of a patient visit (documented in the note above) non-face-to-face time: obtaining and reviewing outside history, ordering and reviewing medications, tests or procedures, care coordination (communications with other health care professionals or caregivers) and documentation in the medical record.

## 2020-05-11 ENCOUNTER — Inpatient Hospital Stay: Payer: BC Managed Care – PPO | Attending: Hematology and Oncology | Admitting: Oncology

## 2020-05-11 ENCOUNTER — Other Ambulatory Visit: Payer: Self-pay

## 2020-05-11 VITALS — BP 124/88 | HR 83 | Temp 96.3°F | Resp 18 | Ht 65.0 in | Wt 202.5 lb

## 2020-05-11 DIAGNOSIS — Z803 Family history of malignant neoplasm of breast: Secondary | ICD-10-CM | POA: Insufficient documentation

## 2020-05-11 DIAGNOSIS — Z801 Family history of malignant neoplasm of trachea, bronchus and lung: Secondary | ICD-10-CM | POA: Insufficient documentation

## 2020-05-11 DIAGNOSIS — D0511 Intraductal carcinoma in situ of right breast: Secondary | ICD-10-CM | POA: Diagnosis not present

## 2020-05-11 DIAGNOSIS — Z87891 Personal history of nicotine dependence: Secondary | ICD-10-CM | POA: Diagnosis not present

## 2020-05-11 MED ORDER — TAMOXIFEN CITRATE 20 MG PO TABS: 20.0000 mg | ORAL_TABLET | Freq: Every day | ORAL | 4 refills | Status: AC

## 2020-05-15 ENCOUNTER — Encounter: Payer: Self-pay | Admitting: Surgical

## 2020-05-15 ENCOUNTER — Ambulatory Visit (INDEPENDENT_AMBULATORY_CARE_PROVIDER_SITE_OTHER): Payer: BC Managed Care – PPO | Admitting: Surgical

## 2020-05-15 ENCOUNTER — Other Ambulatory Visit: Payer: Self-pay

## 2020-05-15 VITALS — BP 135/84 | HR 98 | Temp 98.1°F

## 2020-05-15 DIAGNOSIS — D0511 Intraductal carcinoma in situ of right breast: Secondary | ICD-10-CM

## 2020-05-15 NOTE — Progress Notes (Signed)
Patient is a 50 year old female here for follow-up after bilateral oncoplastic breast reduction.  Patient underwent right breast lumpectomy with radioactive seed localization by Dr. Marlou Starks followed by the bilateral oncoplastic breast reduction with Dr. Marla Roe on 04/27/2020.  Patient has seen oncology and they are recommending tamoxifen starting July 24, 2020.  Patient reports that she is scheduled to see radiation oncology December 6 and may begin radiation the following week.  She reports that overall she is doing well.  Chaperone present on exam On exam bilateral breast incisions intact, honeycomb and Steri-Strip dressings in place.  Bilateral NAC's are viable with good color.  Incision CDI.  No erythema noted.   Recommend continue to avoid strenuous activities, continue to wear compressive garment 24/7 for a few more weeks. Recommend following up in 1 to 2 weeks prior to beginning radiation therapy to be sure she has no wounds.  We can remove Steri-Strips at that time.  Honeycomb dressing was removed.  There is no sign of infection, seroma, hematoma.

## 2020-05-24 NOTE — Progress Notes (Signed)
Location of Breast Cancer: Malignant neoplasm of overlapping sites of RIGHT breast, estrogen receptor positive   Histology per Pathology Report:  04/27/2020 FINAL MICROSCOPIC DIAGNOSIS:  A. BREAST, RIGHT, LUMPECTOMY:  - High grade ductal carcinoma in situ with necrosis.  - In situ carcinoma is 1 mm from the anterior margin focally.  - Biopsy site.  - See oncology table.  B. BREAST, RIGHT ADDITIONAL MEDIAL MARGIN, EXCISION:  - Benign breast tissue with fibrocystic and fibroadenomatoid change.  C. BREAST, RIGHT INFERIOR MARGIN, EXCISION:  - Benign breast tissue with fibrocystic and fibroadenomatoid change.  D. BREAST, RIGHT INFERIOR, MAMMOPLASTY:  - Benign breast tissue.  E. SKIN, RIGHT BREAST, EXCISION:  - Benign skin.  F. BREAST, LEFT, MAMMOPLASTY:  - Benign breast tissue with fibrocystic and fibroadenomatoid change.  - Adenosis.   Receptor Status: ER(80%), PR (50%)  Did patient present with symptoms (if so, please note symptoms) or was this found on screening mammography?: Screening mammogram on 02/08/2020 detected right breast calcifications. Diagnostic mammogram on 02/23/2020 showed a 0.9cm group of right breast calcifications.  Past/Anticipated interventions by surgeon, if any: 04/27/2020 -Dr. Autumn Messing RIGHT BREAST LUMPECTOMY WITH RADIOACTIVE SEED LOCALIZATION  -Dr. Lyndee Leo Dillingham Bilateral breast reduction.  Right reduction 75 g, Left reduction 85 g  Past/Anticipated interventions by medical oncology, if any:  Under care of Dr. Sarajane Jews Magrinat 05/11/2020 (1) genetics testing 03/28/2020 through the [facility] Common Hereditary Cancers Panel found no deleterious mutations in APC, ATM, AXIN2, BARD1, BMPR1A, BRCA1, BRCA2, BRIP1, CDH1, CDK4, CDKN2A (p14ARF), CDKN2A (p16INK4a), CHEK2, CTNNA1, DICER1, EPCAM (Deletion/duplication testing only), GREM1 (promoter region deletion/duplication testing only), KIT, MEN1, MLH1, MSH2, MSH3, MSH6, MUTYH, NBN, NF1, NHTL1, PALB2, PDGFRA, PMS2,  POLD1, POLE, PTEN, RAD50, RAD51C, RAD51D, RNF43, SDHB, SDHC, SDHD, SMAD4, SMARCA4. STK11, TP53, TSC1, TSC2, and VHL.  The following genes were evaluated for sequence changes only: SDHA and HOXB13 c.251G>A variant only.             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 (2) status post right lumpectomy 04/27/2020 for ductal carcinoma in situ, high-grade, with negative margins.             (a) status post bilateral breast reduction 04/27/2020 (3) adjuvant radiation pending (4) to start tamoxifen 07/24/2020  --She will then return to see me late March 2022 to make sure she is tolerating tamoxifen well and if so I will set her up for mammography and yearly visits thereafter  Lymphedema issues, if any:  Patient denies    Pain issues, if any:  Patient denies   SAFETY ISSUES:  Prior radiation? No  Pacemaker/ICD? No  Possible current pregnancy? No--robotic assisted laparoscopic hysterectomy   Is the patient on methotrexate? No  Current Complaints / other details:  Nothing else of note

## 2020-05-25 ENCOUNTER — Other Ambulatory Visit: Payer: Self-pay

## 2020-05-25 ENCOUNTER — Ambulatory Visit
Admission: RE | Admit: 2020-05-25 | Discharge: 2020-05-25 | Disposition: A | Discharge status: Home / Self Care | Payer: BC Managed Care – PPO | Source: Ambulatory Visit | Attending: Radiation Oncology | Admitting: Radiation Oncology

## 2020-05-25 ENCOUNTER — Encounter: Payer: Self-pay | Admitting: Radiation Oncology

## 2020-05-25 DIAGNOSIS — Z170 Estrogen receptor positive status [ER+]: Secondary | ICD-10-CM | POA: Diagnosis not present

## 2020-05-25 DIAGNOSIS — D0511 Intraductal carcinoma in situ of right breast: Secondary | ICD-10-CM | POA: Diagnosis not present

## 2020-05-25 DIAGNOSIS — C50811 Malignant neoplasm of overlapping sites of right female breast: Secondary | ICD-10-CM | POA: Diagnosis not present

## 2020-05-25 NOTE — Progress Notes (Signed)
Radiation Oncology         [id number]) [phone] ________________________________  Name: Catherine Santos MRN: [id number]  Date: 05/25/2020  DOB: 24-Jan-1971  Follow-Up Visit Note by telephone.  The patient opted for telemedicine to maximize safety during the pandemic.  MyChart video was not obtainable.  Outpatient  CC: [place], [place], [place]  Nicholas Lose, MD  Diagnosis:      ICD-10-CM   1. Malignant neoplasm of overlapping sites of right breast in female, estrogen receptor positive ([place])  C50.811    Z17.0   2. Breast neoplasm, Tis (DCIS), right  D05.11     Cancer Staging Breast neoplasm, Tis (DCIS), right Staging form: Breast, AJCC 8th Edition - Clinical: Stage 0 (cTis (DCIS), cN0, cM0, ER+, PR+) - Signed by Gardenia Phlegm, NP on 03/14/2020    CHIEF COMPLAINT: Here to discuss management of right breast DCIS  Narrative:  The patient returns today for follow-up. She was seen in consultation on 03/20/2020, during which time we discussed breast conserving surgery followed by radiotherapy.    Since consultation date, she has not undergone any significant imaging.  Breast surgery on the date of 04/27/2020 revealed: DCIS that Involves non-contiguous blocks, + for necrosis; margin status to in situ disease of 1 mm from the anterior margin focally; ER status: 80% strong; PR status: 50% strong; Grade: 3.  Symptomatically, the patient reports: healing well; she sees plastic surgery for follow-up next week  Lymphedema issues, if any:  Patient denies    Pain issues, if any:  Patient denies   SAFETY ISSUES:  Prior radiation? No  Pacemaker/ICD? No  Possible current pregnancy? No--robotic assisted laparoscopic hysterectomy   Is the patient on methotrexate? No  Current Complaints / other details:  Nothing else of note           ALLERGIES:  is allergic to phentermine, penicillins, and sulfa antibiotics.  Meds: Current Outpatient Medications  Medication Sig Dispense Refill  .  Ascorbic Acid (VITAMIN C) 500 MG CAPS Take 2 capsules by mouth daily.    Marland Kitchen CALCIUM PO Take by mouth.    . Cholecalciferol (D3-1000) 25 MCG (1000 UT) tablet Take 1,000 Units by mouth daily. 2 tablets daily    . ELDERBERRY PO Take by mouth. + vitamin c, zinc    . Multiple Vitamin (MULTIVITAMIN PO) Take by mouth.    . tamoxifen (NOLVADEX) 20 MG tablet Take 1 tablet (20 mg total) by mouth daily. Start Jul 24, 2020 (Patient not taking: Reported on 05/25/2020) 90 tablet 4   No current facility-administered medications for this encounter.    Physical Findings:  vitals were not taken for this visit. .     General: Alert and oriented, in no acute distress   Lab Findings: Lab Results  Component Value Date   WBC 6.5 02/10/2019   HGB 13.7 02/10/2019   HCT 41.1 02/10/2019   MCV 91 02/10/2019   PLT 306 02/10/2019      Radiographic Findings: MM Breast Surgical Specimen  Result Date: 04/27/2020 CLINICAL DATA:  Status post RIGHT breast lumpectomy today after earlier radioactive seed localization. EXAM: SPECIMEN RADIOGRAPH OF THE RIGHT BREAST COMPARISON:  Previous exam(s). FINDINGS: Status post excision of the right breast. The radioactive seed and biopsy marker clip are present, completely intact, and were marked for pathology. Findings discussed with the OR staff during the procedure. IMPRESSION: Specimen radiograph of the right breast. Electronically Signed   By: Franki Cabot M.D.   On: 04/27/2020 14:06   MM  RT RADIOACTIVE SEED LOC MAMMO GUIDE  Result Date: 04/26/2020 CLINICAL DATA:  49 year old female with recently diagnosed right breast DCIS presents for preoperative radioactive seed localization prior to planned lumpectomy. The biopsy marking clip was noted to be located greater than 2 cm superior to the biopsied calcifications in the upper-outer right breast. EXAM: MAMMOGRAPHIC GUIDED RADIOACTIVE SEED LOCALIZATION OF THE RIGHT BREAST COMPARISON:  Previous exam(s). FINDINGS: Patient presents for  radioactive seed localization prior to right breast lumpectomy. I met with the patient and we discussed the procedure of seed localization including benefits and alternatives. We discussed the high likelihood of a successful procedure. We discussed the risks of the procedure including infection, bleeding, tissue injury and further surgery. We discussed the low dose of radioactivity involved in the procedure. Informed, written consent was given. The usual time-out protocol was performed immediately prior to the procedure. Using mammographic guidance, sterile technique, 1% lidocaine and an I-125 radioactive seed, the calcifications (which are located approximately 2.5 cm inferior to the biopsy marking clip) were localized using a lateral to medial approach. The follow-up mammogram images confirm the seed in the expected location and were marked for Dr. Marlou Starks. Faint calcifications are also seen approximately 2 cm lateral to the biopsy marking clip and radioactive seed. Follow-up survey of the patient confirms presence of the radioactive seed. Order number of I-125 seed:  [id number]. Total activity:  1.594 millicuries reference Date: 03/23/2020 The patient tolerated the procedure well and was released from the [facility]. She was given instructions regarding seed removal. IMPRESSION: 1.  Radioactive seed localization right breast. 2. Faint calcifications are present/extend approximately 2 cm lateral to the biopsy marking clip and radioactive seed. This was discussed with Dr. Marlou Starks 04/26/2020 at 11:45 a.m. Electronically Signed   By: Everlean Alstrom M.D.   On: 04/26/2020 11:50    Impression/Plan: Right breast DCIS  We discussed adjuvant radiotherapy today.  I recommend radiation therapy to the right breast over 4 weeks in order to reduce risk of local recurrence by half.  I reviewed the logistics, benefits, risks, and potential side effects of this treatment in detail. Risks may include but not necessary be limited  to acute and late injury tissue in the radiation fields such as skin irritation (change in color/pigmentation, itching, dryness, pain, peeling). She may experience fatigue. We also discussed possible risk of long term cosmetic changes or scar tissue. There is also a smaller risk for lung toxicity, shinking of the breast, lymphedema, musculoskeletal changes, rib fragility or rare induction of a second malignancy, late chronic non-healing soft tissue wound.    The patient asked good questions which I answered to her satisfaction. She is enthusiastic about proceeding with treatment. A consent form will be reviewed, signed and placed in her chart at simulation when I see her in person.  A total of 3 medically necessary complex treatment devices will be fabricated and supervised by me: 2 fields with MLCs for custom blocks to protect heart, and lungs;  and, a Vac-lok. MORE COMPLEX DEVICES MAY BE MADE IN DOSIMETRY FOR FIELD IN FIELD BEAMS FOR DOSE HOMOGENEITY.  I have requested : 3D Simulation which is medically necessary to give adequate dose to at risk tissues while sparing lungs and heart.  I have requested a DVH of the following structures: lungs, heart, right lumpectomy cavity (if traceable with clips).    The patient will receive 40.05 Gy in 15 fractions to the right breast with 2 fields.  This will be followed by a  boost if the target can be determined based on her anatomy.  We discussed measures to reduce the risk of infection during the COVID-19 pandemic.  She is due for her booster shot and would like to proceed.  We will administer her booster shot at the cancer center on Monday after her CT simulation.  This encounter was provided by telemedicine platform; patient desired telemedicine during pandemic precautions. Video conference was not available so telephone was used. The patient has given verbal consent for this type of encounter and has been advised to only accept a meeting of this type in a  secure network environment. On date of service, in total, I spent 30 minutes on this encounter.   The attendants for this meeting include Eppie Gibson  and Pauline Good During the encounter, Eppie Gibson was located at [facility] Radiation Oncology Department.  Catherine Santos was located at home.  _____________________________________   Eppie Gibson, MD  This document serves as a record of services personally performed by Eppie Gibson, MD. It was created on his behalf by Clerance Lav, a trained medical scribe. The creation of this record is based on the scribe's personal observations and the provider's statements to them. This document has been checked and approved by the attending provider.

## 2020-05-28 ENCOUNTER — Encounter: Payer: Self-pay | Admitting: Surgical

## 2020-05-28 ENCOUNTER — Ambulatory Visit (INDEPENDENT_AMBULATORY_CARE_PROVIDER_SITE_OTHER): Payer: BC Managed Care – PPO | Admitting: Surgical

## 2020-05-28 ENCOUNTER — Inpatient Hospital Stay: Payer: BC Managed Care – PPO | Attending: Hematology and Oncology

## 2020-05-28 ENCOUNTER — Ambulatory Visit
Admission: RE | Admit: 2020-05-28 | Discharge: 2020-05-28 | Disposition: A | Discharge status: Home / Self Care | Payer: BC Managed Care – PPO | Source: Ambulatory Visit | Attending: Radiation Oncology | Admitting: Radiation Oncology

## 2020-05-28 ENCOUNTER — Other Ambulatory Visit: Payer: Self-pay

## 2020-05-28 VITALS — BP 121/81 | HR 84 | Temp 97.8°F

## 2020-05-28 DIAGNOSIS — C50811 Malignant neoplasm of overlapping sites of right female breast: Secondary | ICD-10-CM | POA: Insufficient documentation

## 2020-05-28 DIAGNOSIS — Z9889 Other specified postprocedural states: Secondary | ICD-10-CM

## 2020-05-28 DIAGNOSIS — Z510 Encounter for antineoplastic radiation therapy: Secondary | ICD-10-CM | POA: Insufficient documentation

## 2020-05-28 DIAGNOSIS — D0511 Intraductal carcinoma in situ of right breast: Secondary | ICD-10-CM | POA: Diagnosis present

## 2020-05-28 DIAGNOSIS — Z23 Encounter for immunization: Secondary | ICD-10-CM

## 2020-05-28 DIAGNOSIS — Z803 Family history of malignant neoplasm of breast: Secondary | ICD-10-CM | POA: Diagnosis not present

## 2020-05-28 DIAGNOSIS — Z87891 Personal history of nicotine dependence: Secondary | ICD-10-CM | POA: Insufficient documentation

## 2020-05-28 DIAGNOSIS — Z801 Family history of malignant neoplasm of trachea, bronchus and lung: Secondary | ICD-10-CM | POA: Insufficient documentation

## 2020-05-28 NOTE — Progress Notes (Signed)
Patient is a 49 year old female here for follow-up after bilateral oncoplastic breast reduction with Dr. Marla Roe on 04/27/2020.  Patient reports she is doing well.  She is scheduled to start radiation next week.  She reports that the Steri-Strips came off yesterday in the shower.  Chaperone present on exam On exam bilateral NAC's are viable, bilateral breast incisions intact.  She does have a little bit of irritation from the Steri-Strips on bilateral lower breasts.  There is no wounds noted.  No foul odor is noted.  No incisional dehiscence noted.  Discussed with patient that starting radiation therapy next week should be fine.  She has healed nicely so far and I do not see any signs of incisional dehiscence or wound breakdown.  She will be 5 weeks postop next week.  Recommend she call with any questions or concerns.  Patient is scheduled to finish radiation mid January, would like to see patient back after completion of radiation.  I discussed with the patient that if she has any issues or concerns during radiation, we would be happy to see her and evaluate any concerns or changes.  Pictures were obtained of the patient and placed in the chart with the patient's or guardian's permission.

## 2020-05-28 NOTE — Progress Notes (Signed)
Pt remained for 15 minutes after injection, tolerated well, stable at time of d/c.

## 2020-06-01 DIAGNOSIS — C50811 Malignant neoplasm of overlapping sites of right female breast: Secondary | ICD-10-CM | POA: Diagnosis not present

## 2020-06-04 ENCOUNTER — Encounter: Payer: Self-pay | Admitting: *Deleted

## 2020-06-05 ENCOUNTER — Ambulatory Visit
Admission: RE | Admit: 2020-06-05 | Discharge: 2020-06-05 | Disposition: A | Discharge status: Home / Self Care | Payer: BC Managed Care – PPO | Source: Ambulatory Visit | Attending: Radiation Oncology | Admitting: Radiation Oncology

## 2020-06-05 DIAGNOSIS — C50811 Malignant neoplasm of overlapping sites of right female breast: Secondary | ICD-10-CM | POA: Diagnosis not present

## 2020-06-06 ENCOUNTER — Ambulatory Visit
Admission: RE | Admit: 2020-06-06 | Discharge: 2020-06-06 | Disposition: A | Discharge status: Home / Self Care | Payer: BC Managed Care – PPO | Source: Ambulatory Visit | Attending: Radiation Oncology | Admitting: Radiation Oncology

## 2020-06-06 DIAGNOSIS — C50811 Malignant neoplasm of overlapping sites of right female breast: Secondary | ICD-10-CM | POA: Diagnosis not present

## 2020-06-06 NOTE — Progress Notes (Signed)
[place]  Telephone:(336) [phone] Fax:(336) [phone]     ID: Catherine Santos DOB: October 09, 1970  MR#: [id number]  YYQ#:[id number]  Patient Care Team: Willeen Niece, [place] as PCP - General (Physician Assistant) Mauro Kaufmann, RN as Oncology Nurse Navigator Rockwell Germany, RN as Oncology Nurse Navigator Deniz Eskridge, Virgie Dad, MD as Consulting Physician (Oncology) Jovita Kussmaul, MD as Consulting Physician (General Surgery) Eppie Gibson, MD as Attending Physician (Radiation Oncology) Megan Salon, MD as Consulting Physician (Gynecology) Dillingham, Loel Lofty, DO as Attending Physician (Plastic Surgery) Chauncey Cruel, MD OTHER MD:  CHIEF COMPLAINT: noninvasive breast cancer, estrogen receptor positive  CURRENT TREATMENT: Adjuvant radiation   INTERVAL HISTORY: Catherine Santos returns today for follow up of her noninvasive breast cancer.  She met with Dr. Isidore Moos on 05/25/2020 to review radiation therapy. She subsequently began treatment on 06/05/2020, and she is scheduled to finish on 07/05/2019.   REVIEW OF SYSTEMS: Catherine Santos had some questions regarding the changes in the right breast with radiation.  Letter of course will also be changes more in the left than the right breast when she gains and loses weight.  She is not exercising as often as she was before.  She used to do 3 miles most days with some friends and lately they have not been doing that.  She is planning to reinstate that.  She is looking forward to the holidays when her son and his wife will be visiting for Christmas.  A detailed review of systems today was otherwise entirely benign   COVID 19 VACCINATION STATUS: s/p Pfizer x 2, most recently April 2021   HISTORY OF CURRENT ILLNESS: From Dr. Geralyn Flash original intake note:   "Catherine Santos 49 y.o. female is here because of recent diagnosis of right breast ductal carcinoma in situ. Screening mammogram on 02/08/20 detected right breast calcifications. Diagnostic  mammogram on 02/23/20 showed a 0.9cm group of right breast calcifications. 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 Marlou Starks. Pathology from the procedure [phone]) showed: ductal carcinoma in situ, high grade, with necrosis; margins negative.  The patient's subsequent history is as detailed below.   PAST MEDICAL HISTORY: Past Medical History:  Diagnosis Date  . Abnormal Pap smear   . Cancer [facility])    breast cancer  . Dog bite 04/2017  . Family history of breast cancer 03/23/2020  . Family history of prostate cancer 03/23/2020    PAST SURGICAL HISTORY: Past Surgical History:  Procedure Laterality Date  . BREAST LUMPECTOMY WITH RADIOACTIVE SEED LOCALIZATION Right 04/27/2020   Procedure: RIGHT BREAST LUMPECTOMY WITH RADIOACTIVE SEED LOCALIZATION;  Surgeon: Jovita Kussmaul, MD;  Location: [facility];  Service: General;  Laterality: Right;  . BREAST REDUCTION SURGERY Bilateral 04/27/2020   Procedure: ONCOPLASTIC BREAST BILATERAL REDUCTION;  Surgeon: Wallace Going, DO;  Location: [facility];  Service: Plastics;  Laterality: Bilateral;  . CYSTOSCOPY  12/30/2011   Procedure: CYSTOSCOPY;  Surgeon: Lyman Speller, MD;  Location: [facility];  Service: Gynecology;  Laterality: N/A;  . ROBOTIC ASSISTED LAP VAGINAL HYSTERECTOMY    . SALPINGOOPHORECTOMY  12/30/2011   Procedure: SALPINGO OOPHERECTOMY;  Surgeon: Lyman Speller, MD;  Location: [facility];  Service: Gynecology;  Laterality: Left;  . TUBAL LIGATION      FAMILY HISTORY: Family History  Problem Relation Age of Onset  . Hypertension Mother   . Diabetes Mother   .  Thyroid disease Mother   . Breast cancer Cousin        maternal cousin-1st , diag 36s  . Prostate cancer Maternal Uncle        dx unknown age  . Lung cancer Paternal Grandfather        dx 49s; smoking hx  . Cancer Maternal Uncle        unknown  type; unknown age dx  . Colon cancer Maternal Uncle        dx 31s  . Cancer Cousin        maternal cousin; GYN cancer, unknown age dx  As of November 2021 the patient's mother is 58 years old with no cancer history.  A maternal uncle had prostate cancer and another colon cancer.  A third had an unknown type of cancer.  A maternal cousin had breast cancer in her 54s and a second maternal cousin had an unknown gynecologic cancer.  The patient's father is 65 years old as of November 2021.  The paternal grandfather died from lung cancer at an advanced age.  There is no other family history of cancer to the patient's knowledge.   GYNECOLOGIC HISTORY:  Patient's last menstrual period was 11/17/2011. Menarche: 49 years old Age at first live birth: 49 years old [place] P 1 LMP status post hysterectomy Contraceptive remotely, without complications HRT N/A  Hysterectomy? yes BSO? 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 She is single.  [name], 37 years old, is a Engineer, building services and his wife is in medical school in [place] where her son is planning to move soon.  The patient has no grandchildren.  She lives by herself with two boxers.  She is not a church attender    ADVANCED DIRECTIVES: Not in place.  She tells me she intends to name [name] as healthcare power of attorney.  At the 06/07/2020 visit she was given the appropriate documents to complete and notarize at her discretion.   HEALTH MAINTENANCE: Social History   Tobacco Use  . Smoking status: Former Smoker    Types: Cigarettes  . Smokeless tobacco: Never Used  Vaping Use  . Vaping Use: Never used  Substance Use Topics  . Alcohol use: Not Currently    Alcohol/week: 2.0 - 3.0 standard drinks    Types: 2 - 3 Standard drinks or equivalent per week    Comment: on weekends  . Drug use: No     Colonoscopy: Cologard 01/2020 negative (performed at  [facility])  PAP:  (s/p hysterectomy)  Bone density: n/a (age)   Allergies  Allergen Reactions  . Phentermine Palpitations  . Penicillins Other (See Comments)    Pt doesn't remember reaction to PCN- was in childhood  . Sulfa Antibiotics Rash    Current Outpatient Medications  Medication Sig Dispense Refill  . Ascorbic Acid (VITAMIN C) 500 MG CAPS Take 2 capsules by mouth daily.    Marland Kitchen CALCIUM PO Take by mouth.    . Cholecalciferol (D3-1000) 25 MCG (1000 UT) tablet Take 1,000 Units by mouth daily. 2 tablets daily    . ELDERBERRY PO Take by mouth. + vitamin c, zinc    . Multiple Vitamin (MULTIVITAMIN PO) Take by mouth.    . tamoxifen (NOLVADEX) 20 MG tablet Take 1 tablet (20 mg total) by mouth daily. Start Jul 24, 2020 90 tablet 4   No current facility-administered medications for this visit.  OBJECTIVE: White woman in no acute distress  Vitals:   06/07/20 1527  BP: 113/74  Pulse: 89  Resp: 20  Temp: 97.8 F (36.6 C)  SpO2: 98%     Body mass index is 33.35 kg/m.   Wt Readings from Last 3 Encounters:  06/07/20 200 lb 6.4 oz (90.9 kg)  05/11/20 202 lb 8 oz (91.9 kg)  04/27/20 201 lb 4.5 oz (91.3 kg)      ECOG FS:1 - Symptomatic but completely ambulatory  Sclerae unicteric, EOMs intact Wearing a mask No cervical or supraclavicular adenopathy Lungs no rales or rhonchi Heart regular rate and rhythm Abd soft, nontender, positive bowel sounds MSK no focal spinal tenderness, no upper extremity lymphedema Neuro: nonfocal, well oriented, appropriate affect Breasts: The right breast is status post lumpectomy.  Both breasts are also status post reduction mammoplasty.  The cosmetic result is very good.  There is no dehiscence erythema or inflammation.  There is a little bit of scaling in the areas where she had tape.  Both axillae are benign   LAB RESULTS:  CMP     Component Value Date/Time   NA 137 02/10/2019 0920   K 4.4 02/10/2019 0920   CL 101 02/10/2019 0920   CO2 20  02/10/2019 0920   GLUCOSE 96 02/10/2019 0920   GLUCOSE 106 (H) 08/05/2016 1422   BUN 13 02/10/2019 0920   CREATININE 0.71 02/10/2019 0920   CREATININE 0.83 08/05/2016 1422   CALCIUM 9.4 02/10/2019 0920   PROT 7.2 02/10/2019 0920   ALBUMIN 4.7 02/10/2019 0920   AST 18 02/10/2019 0920   ALT 17 02/10/2019 0920   ALKPHOS 65 02/10/2019 0920   BILITOT 0.3 02/10/2019 0920   GFRNONAA 102 02/10/2019 0920   GFRAA 117 02/10/2019 0920    No results found for: TOTALPROTELP, ALBUMINELP, A1GS, A2GS, BETS, BETA2SER, GAMS, MSPIKE, SPEI  Lab Results  Component Value Date   WBC 6.5 02/10/2019   NEUTROABS 4.6 11/03/2017   HGB 13.7 02/10/2019   HCT 41.1 02/10/2019   MCV 91 02/10/2019   PLT 306 02/10/2019    No results found for: LABCA2  No components found for: TDSKAJ681  No results for input(s): INR in the last 168 hours.  No results found for: LABCA2  No results found for: LXB262  No results found for: MBT597  No results found for: CBU384  No results found for: CA2729  No components found for: HGQUANT  No results found for: CEA1 / No results found for: CEA1   No results found for: AFPTUMOR  No results found for: CHROMOGRNA  No results found for: KPAFRELGTCHN, LAMBDASER, KAPLAMBRATIO (kappa/lambda light chains)  No results found for: HGBA, HGBA2QUANT, HGBFQUANT, HGBSQUAN (Hemoglobinopathy evaluation)   No results found for: LDH  No results found for: IRON, TIBC, IRONPCTSAT (Iron and TIBC)  No results found for: FERRITIN  Urinalysis    Component Value Date/Time   BILIRUBINUR N 11/03/2017 1505   PROTEINUR N 11/03/2017 1505   UROBILINOGEN 0.2 11/03/2017 1505   NITRITE N 11/03/2017 1505   LEUKOCYTESUR Small (1+) (A) 11/03/2017 1505    STUDIES: No results found.   ELIGIBLE FOR AVAILABLE RESEARCH PROTOCOL: AET  ASSESSMENT: 49 y.o. Catherine Santos woman status post right breast biopsy 03/07/2020 for ductal carcinoma in situ, intermediate to high-grade, estrogen and  progesterone receptor positive.  (1) genetics testing 03/28/2020 through the [facility] Common Hereditary Cancers Panel found no deleterious mutations in APC, ATM, AXIN2, BARD1, BMPR1A, BRCA1, BRCA2, BRIP1, CDH1, CDK4, CDKN2A (p14ARF), CDKN2A (  p16INK4a), CHEK2, CTNNA1, DICER1, EPCAM (Deletion/duplication testing only), GREM1 (promoter region deletion/duplication testing only), KIT, MEN1, MLH1, MSH2, MSH3, MSH6, MUTYH, NBN, NF1, NHTL1, PALB2, PDGFRA, PMS2, POLD1, POLE, PTEN, RAD50, RAD51C, RAD51D, RNF43, SDHB, SDHC, SDHD, SMAD4, SMARCA4. STK11, TP53, TSC1, TSC2, and VHL.  The following genes were evaluated for sequence changes only: SDHA and HOXB13 c.251G>A variant only.  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  (2) status post right lumpectomy 04/27/2020 for ductal carcinoma in situ, high-grade, with negative margins.  (a) status post bilateral breast reduction 04/27/2020  (3) adjuvant radiation to be completed 07/05/2019  (4) to start tamoxifen 07/24/2020   PLAN:  Brenya has done very well with her diagnosis of thankfully noninvasive breast cancer.  She does not seem to be looking on this as a major life changer although frequently it is.  She wants to get back into her exercise program and she is of course continuing to work.  All this is really optimal.  I gave her the healthcare power of attorney documents today so she can discuss that when her son comes by for the holidays.  If she gets it completed and notarized we will place a copy in her chart  She will be done with radiation the second week in January.  She will start tamoxifen 07/24/2020.  I am going to see her virtually in early April by which time we should know if she is going to be able to tolerate tamoxifen without any complications.  If so then the plan would be for tamoxifen for 5 years, mammography in August, and yearly visits in September  She knows to call for any other issue that may develop  before the next visit  Total encounter time 25 minutes.Sarajane Jews C. Florabelle Cardin, MD 06/07/2020 3:33 PM Medical Oncology and Hematology [facility] [place], [place] [address] Tel. [phone]    Fax. [phone]   This document serves as a record of services personally performed by Lurline Del, MD. It was created on his behalf by Wilburn Mylar, a trained medical scribe. The creation of this record is based on the scribe's personal observations and the provider's statements to them.   I, Lurline Del MD, have reviewed the above documentation for accuracy and completeness, and I agree with the above.   *Total Encounter Time as defined by the Centers for Medicare and Medicaid Services includes, in addition to the face-to-face time of a patient visit (documented in the note above) non-face-to-face time: obtaining and reviewing outside history, ordering and reviewing medications, tests or procedures, care coordination (communications with other health care professionals or caregivers) and documentation in the medical record.

## 2020-06-07 ENCOUNTER — Other Ambulatory Visit: Payer: Self-pay

## 2020-06-07 ENCOUNTER — Ambulatory Visit
Admission: RE | Admit: 2020-06-07 | Discharge: 2020-06-07 | Disposition: A | Discharge status: Home / Self Care | Payer: BC Managed Care – PPO | Source: Ambulatory Visit | Attending: Radiation Oncology | Admitting: Radiation Oncology

## 2020-06-07 ENCOUNTER — Inpatient Hospital Stay (HOSPITAL_BASED_OUTPATIENT_CLINIC_OR_DEPARTMENT_OTHER): Payer: BC Managed Care – PPO | Admitting: Oncology

## 2020-06-07 VITALS — BP 113/74 | HR 89 | Temp 97.8°F | Resp 20 | Ht 65.0 in | Wt 200.4 lb

## 2020-06-07 DIAGNOSIS — D0511 Intraductal carcinoma in situ of right breast: Secondary | ICD-10-CM

## 2020-06-07 DIAGNOSIS — C50811 Malignant neoplasm of overlapping sites of right female breast: Secondary | ICD-10-CM | POA: Diagnosis not present

## 2020-06-08 ENCOUNTER — Ambulatory Visit
Admission: RE | Admit: 2020-06-08 | Discharge: 2020-06-08 | Disposition: A | Discharge status: Home / Self Care | Payer: BC Managed Care – PPO | Source: Ambulatory Visit | Attending: Radiation Oncology | Admitting: Radiation Oncology

## 2020-06-08 DIAGNOSIS — C50811 Malignant neoplasm of overlapping sites of right female breast: Secondary | ICD-10-CM | POA: Diagnosis not present

## 2020-06-11 ENCOUNTER — Telehealth: Payer: Self-pay | Admitting: Oncology

## 2020-06-11 ENCOUNTER — Ambulatory Visit
Admission: RE | Admit: 2020-06-11 | Discharge: 2020-06-11 | Disposition: A | Discharge status: Home / Self Care | Payer: BC Managed Care – PPO | Source: Ambulatory Visit | Attending: Radiation Oncology | Admitting: Radiation Oncology

## 2020-06-11 DIAGNOSIS — C50811 Malignant neoplasm of overlapping sites of right female breast: Secondary | ICD-10-CM | POA: Diagnosis not present

## 2020-06-11 NOTE — Telephone Encounter (Signed)
Scheduled appts per 12/16 los. Left voicemail with appt date and time.

## 2020-06-12 ENCOUNTER — Ambulatory Visit
Admission: RE | Admit: 2020-06-12 | Discharge: 2020-06-12 | Disposition: A | Discharge status: Home / Self Care | Payer: BC Managed Care – PPO | Source: Ambulatory Visit | Attending: Radiation Oncology | Admitting: Radiation Oncology

## 2020-06-12 DIAGNOSIS — C50811 Malignant neoplasm of overlapping sites of right female breast: Secondary | ICD-10-CM | POA: Diagnosis not present

## 2020-06-13 ENCOUNTER — Ambulatory Visit
Admission: RE | Admit: 2020-06-13 | Discharge: 2020-06-13 | Disposition: A | Discharge status: Home / Self Care | Payer: BC Managed Care – PPO | Source: Ambulatory Visit | Attending: Radiation Oncology | Admitting: Radiation Oncology

## 2020-06-13 DIAGNOSIS — C50811 Malignant neoplasm of overlapping sites of right female breast: Secondary | ICD-10-CM | POA: Diagnosis not present

## 2020-06-14 ENCOUNTER — Ambulatory Visit
Admission: RE | Admit: 2020-06-14 | Discharge: 2020-06-14 | Disposition: A | Discharge status: Home / Self Care | Payer: BC Managed Care – PPO | Source: Ambulatory Visit | Attending: Radiation Oncology | Admitting: Radiation Oncology

## 2020-06-14 ENCOUNTER — Other Ambulatory Visit: Payer: Self-pay

## 2020-06-14 DIAGNOSIS — C50811 Malignant neoplasm of overlapping sites of right female breast: Secondary | ICD-10-CM | POA: Diagnosis not present

## 2020-06-18 ENCOUNTER — Ambulatory Visit: Payer: BC Managed Care – PPO

## 2020-06-19 ENCOUNTER — Other Ambulatory Visit: Payer: Self-pay

## 2020-06-19 ENCOUNTER — Ambulatory Visit
Admission: RE | Admit: 2020-06-19 | Discharge: 2020-06-19 | Disposition: A | Discharge status: Home / Self Care | Payer: BC Managed Care – PPO | Source: Ambulatory Visit | Attending: Radiation Oncology | Admitting: Radiation Oncology

## 2020-06-19 DIAGNOSIS — C50811 Malignant neoplasm of overlapping sites of right female breast: Secondary | ICD-10-CM | POA: Diagnosis not present

## 2020-06-20 ENCOUNTER — Ambulatory Visit
Admission: RE | Admit: 2020-06-20 | Discharge: 2020-06-20 | Disposition: A | Discharge status: Home / Self Care | Payer: BC Managed Care – PPO | Source: Ambulatory Visit | Attending: Radiation Oncology | Admitting: Radiation Oncology

## 2020-06-20 DIAGNOSIS — C50811 Malignant neoplasm of overlapping sites of right female breast: Secondary | ICD-10-CM | POA: Diagnosis not present

## 2020-06-21 ENCOUNTER — Ambulatory Visit
Admission: RE | Admit: 2020-06-21 | Discharge: 2020-06-21 | Disposition: A | Discharge status: Home / Self Care | Payer: BC Managed Care – PPO | Source: Ambulatory Visit | Attending: Radiation Oncology | Admitting: Radiation Oncology

## 2020-06-21 DIAGNOSIS — C50811 Malignant neoplasm of overlapping sites of right female breast: Secondary | ICD-10-CM | POA: Diagnosis not present

## 2020-06-25 ENCOUNTER — Ambulatory Visit
Admission: RE | Admit: 2020-06-25 | Discharge: 2020-06-25 | Disposition: A | Discharge status: Home / Self Care | Payer: BC Managed Care – PPO | Source: Ambulatory Visit | Attending: Radiation Oncology | Admitting: Radiation Oncology

## 2020-06-25 ENCOUNTER — Ambulatory Visit: Payer: BC Managed Care – PPO | Admitting: Radiation Oncology

## 2020-06-25 DIAGNOSIS — C50811 Malignant neoplasm of overlapping sites of right female breast: Secondary | ICD-10-CM | POA: Diagnosis present

## 2020-06-25 DIAGNOSIS — Z510 Encounter for antineoplastic radiation therapy: Secondary | ICD-10-CM | POA: Diagnosis not present

## 2020-06-26 ENCOUNTER — Ambulatory Visit
Admission: RE | Admit: 2020-06-26 | Discharge: 2020-06-26 | Disposition: A | Discharge status: Home / Self Care | Payer: BC Managed Care – PPO | Source: Ambulatory Visit | Attending: Radiation Oncology | Admitting: Radiation Oncology

## 2020-06-26 ENCOUNTER — Other Ambulatory Visit: Payer: Self-pay

## 2020-06-26 DIAGNOSIS — C50811 Malignant neoplasm of overlapping sites of right female breast: Secondary | ICD-10-CM | POA: Diagnosis not present

## 2020-06-27 ENCOUNTER — Ambulatory Visit
Admission: RE | Admit: 2020-06-27 | Discharge: 2020-06-27 | Disposition: A | Discharge status: Home / Self Care | Payer: BC Managed Care – PPO | Source: Ambulatory Visit | Attending: Radiation Oncology | Admitting: Radiation Oncology

## 2020-06-27 DIAGNOSIS — C50811 Malignant neoplasm of overlapping sites of right female breast: Secondary | ICD-10-CM | POA: Diagnosis not present

## 2020-06-28 ENCOUNTER — Ambulatory Visit
Admission: RE | Admit: 2020-06-28 | Discharge: 2020-06-28 | Disposition: A | Discharge status: Home / Self Care | Payer: BC Managed Care – PPO | Source: Ambulatory Visit | Attending: Radiation Oncology | Admitting: Radiation Oncology

## 2020-06-28 ENCOUNTER — Ambulatory Visit: Payer: BC Managed Care – PPO | Admitting: Radiation Oncology

## 2020-06-28 ENCOUNTER — Other Ambulatory Visit: Payer: Self-pay

## 2020-06-28 DIAGNOSIS — C50811 Malignant neoplasm of overlapping sites of right female breast: Secondary | ICD-10-CM | POA: Diagnosis not present

## 2020-06-29 ENCOUNTER — Other Ambulatory Visit: Payer: Self-pay

## 2020-06-29 ENCOUNTER — Ambulatory Visit
Admission: RE | Admit: 2020-06-29 | Discharge: 2020-06-29 | Disposition: A | Discharge status: Home / Self Care | Payer: BC Managed Care – PPO | Source: Ambulatory Visit | Attending: Radiation Oncology | Admitting: Radiation Oncology

## 2020-06-29 ENCOUNTER — Ambulatory Visit: Payer: BC Managed Care – PPO

## 2020-06-29 DIAGNOSIS — C50811 Malignant neoplasm of overlapping sites of right female breast: Secondary | ICD-10-CM | POA: Diagnosis not present

## 2020-07-02 ENCOUNTER — Ambulatory Visit
Admission: RE | Admit: 2020-07-02 | Discharge: 2020-07-02 | Disposition: A | Discharge status: Home / Self Care | Payer: BC Managed Care – PPO | Source: Ambulatory Visit | Attending: Radiation Oncology | Admitting: Radiation Oncology

## 2020-07-02 DIAGNOSIS — C50811 Malignant neoplasm of overlapping sites of right female breast: Secondary | ICD-10-CM | POA: Diagnosis not present

## 2020-07-03 ENCOUNTER — Encounter: Payer: Self-pay | Admitting: *Deleted

## 2020-07-03 ENCOUNTER — Ambulatory Visit
Admission: RE | Admit: 2020-07-03 | Discharge: 2020-07-03 | Disposition: A | Discharge status: Home / Self Care | Payer: BC Managed Care – PPO | Source: Ambulatory Visit | Attending: Radiation Oncology | Admitting: Radiation Oncology

## 2020-07-03 DIAGNOSIS — C50811 Malignant neoplasm of overlapping sites of right female breast: Secondary | ICD-10-CM | POA: Diagnosis not present

## 2020-07-04 ENCOUNTER — Ambulatory Visit
Admission: RE | Admit: 2020-07-04 | Discharge: 2020-07-04 | Disposition: A | Discharge status: Home / Self Care | Payer: BC Managed Care – PPO | Source: Ambulatory Visit | Attending: Radiation Oncology | Admitting: Radiation Oncology

## 2020-07-04 ENCOUNTER — Ambulatory Visit: Payer: BC Managed Care – PPO

## 2020-07-04 DIAGNOSIS — C50811 Malignant neoplasm of overlapping sites of right female breast: Secondary | ICD-10-CM | POA: Diagnosis not present

## 2020-07-05 ENCOUNTER — Encounter: Payer: Self-pay | Admitting: Radiation Oncology

## 2020-07-05 ENCOUNTER — Encounter: Payer: Self-pay | Admitting: *Deleted

## 2020-07-05 ENCOUNTER — Ambulatory Visit
Admission: RE | Admit: 2020-07-05 | Discharge: 2020-07-05 | Disposition: A | Discharge status: Home / Self Care | Payer: BC Managed Care – PPO | Source: Ambulatory Visit | Attending: Radiation Oncology | Admitting: Radiation Oncology

## 2020-07-05 DIAGNOSIS — C50811 Malignant neoplasm of overlapping sites of right female breast: Secondary | ICD-10-CM | POA: Diagnosis not present

## 2020-07-11 ENCOUNTER — Ambulatory Visit (INDEPENDENT_AMBULATORY_CARE_PROVIDER_SITE_OTHER): Payer: BC Managed Care – PPO | Admitting: Surgical

## 2020-07-11 ENCOUNTER — Encounter: Payer: Self-pay | Admitting: Surgical

## 2020-07-11 ENCOUNTER — Other Ambulatory Visit: Payer: Self-pay

## 2020-07-11 DIAGNOSIS — Z9889 Other specified postprocedural states: Secondary | ICD-10-CM

## 2020-07-11 DIAGNOSIS — D0511 Intraductal carcinoma in situ of right breast: Secondary | ICD-10-CM

## 2020-07-11 DIAGNOSIS — Z923 Personal history of irradiation: Secondary | ICD-10-CM

## 2020-07-11 NOTE — Progress Notes (Signed)
Patient is a 50 year old female here for follow-up after bilateral oncoplastic reduction with Dr. Marla Roe on 04/27/2020.  She has completed radiation therapy.  Per EMR review her final treatment was on 07/05/2020.  She reports that overall she did well.   Chaperone present on exam On exam bilateral NAC's are viable, bilateral breast incisions intact.  She does have radiation rash and erythema along the right breast and midline chest.  No wounds noted.  No foul odor is noted.  No incisional dehiscence noted.  We discussed some changes that she might notice after completing radiation therapy including changes to the right breast skin tightness, changes to the size of the right breast.  There is no sign of infection, seroma, hematoma.  I discussed with the patient that we can schedule a follow-up in 6 months for reevaluation.  I recommend in the meantime she call with any questions or concerns or if she would like to be seen sooner.  Patient reports she has been applying over-the-counter cream recommended by radiation oncology team to the right breast radiation rash.  Pictures were obtained of the patient and placed in the chart with the patient's or guardian's permission.

## 2020-07-25 ENCOUNTER — Other Ambulatory Visit: Payer: Self-pay | Admitting: *Deleted

## 2020-07-25 MED ORDER — TAMOXIFEN CITRATE 20 MG PO TABS: 20.0000 mg | ORAL_TABLET | Freq: Every day | ORAL | 3 refills | Status: DC

## 2020-08-13 ENCOUNTER — Telehealth: Payer: Self-pay

## 2020-08-13 NOTE — Telephone Encounter (Signed)
I called the patient today about her upcoming follow-up appointment in radiation oncology.   Given the state of the COVID-19 pandemic, concerning case numbers in our community, and guidance from [facility], I offered a phone assessment with the patient to determine if coming to the clinic was necessary. She accepted.  I let the patient know that I had spoken with Dr. Isidore Moos, and she wanted them to know the importance of washing their hands for at least 20 seconds at a time, especially after going out in public, and before they eat.  Limit going out in public whenever possible. Do not touch your face, unless your hands are clean, such as when bathing. Get plenty of rest, eat well, and stay hydrated. Patient verbalized understanding and agreement  The patient denies any symptomatic concerns. She denies any lingering fatigue, and reports she is back traveling for work. Denies any swelling, tenderness, or range of motion limitations to her right arm. Specifically, she reports good healing of her skin in the radiation fields. She states the mild dermatitis she developed has resolved, and her skin is intact. She stated her skin never blistered or peeled, and just remains slightly hyperpigmented in treatment field. I recommended that she continue skin care by applying oil or lotion with vitamin E to the skin in the radiation fields, BID, for 2 more months.    Continue follow-up with medical oncology - follow-up is scheduled on 09/24/2020 with Dr. Lurline Del.  I explained that yearly mammograms are important for patients with intact breast tissue, and physical exams are important after mastectomy for patients that cannot undergo mammography.  I encouraged her to call if she had further questions or concerns about her healing. Otherwise, she will follow-up PRN in radiation oncology. Patient is pleased with this plan, and we will cancel her upcoming follow-up to reduce the risk of COVID-19  transmission.

## 2020-08-14 ENCOUNTER — Ambulatory Visit: Payer: BC Managed Care – PPO | Admitting: Radiation Oncology

## 2020-09-18 NOTE — Progress Notes (Signed)
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   Plan: The patient will follow-up with radiation oncology in [place] as needed -----------------------------------  SEppie Gibson MD

## 2020-09-23 NOTE — Progress Notes (Signed)
[place]  [phone] [phone]) [phone]     ID: Catherine Santos DOB: 1971-04-11  MR#: [id number]  JYN#:[id number]  Patient Care Team: Willeen Niece, PA as PCP - General (Physician Assistant) Mauro Kaufmann, RN as Oncology Nurse Navigator Rockwell Germany, RN as Oncology Nurse Navigator Bryon Parker, Virgie Dad, MD as Consulting Physician (Oncology) Jovita Kussmaul, MD as Consulting Physician (General Surgery) Eppie Gibson, MD as Attending Physician (Radiation Oncology) Megan Salon, MD as Consulting Physician (Gynecology) Dillingham, Loel Lofty, DO as Attending Physician (Plastic Surgery) Chauncey Cruel, MD OTHER MD:  I connected with Catherine Santos on 09/24/20 at  1:30 PM EDT by video enabled telemedicine visit and verified that I am speaking with the correct person using two identifiers.   I discussed the limitations, risks, security and privacy concerns of performing an evaluation and management service by telemedicine and the availability of in-person appointments. I also discussed with the patient that there may be a patient responsible charge related to this service. The patient expressed understanding and agreed to proceed.   Other persons participating in the visit and their role in the encounter: none  Patient's location: Work Provider's location: [facility]  Total time spent: 20 min   CHIEF COMPLAINT: noninvasive breast cancer, estrogen receptor positive  CURRENT TREATMENT: Tamoxifen   INTERVAL HISTORY: Catherine Santos was contacted today for follow up of her noninvasive breast cancer.  Since her last visit, she finished radiation treatment on 07/05/2019.  She is doing very well as far as her local treatment is concerned, with no significant soreness, shooting pains, or problems with range of motion.  She began tamoxifen on 07/24/2020.  She is having some hot flashes which are occasional, not every day.  She is not having any vaginal  wetness issues.  She is gaining a little weight which she thinks might be related to tamoxifen.  More likely though it is due to menopause   REVIEW OF SYSTEMS: Catherine Santos is now in a low-carb diet eating about 1500 cal a day and hoping to at least control her weight if not losing it.  She is walking daily and she is doing some weights 5 days a week as well.  She is working full-time.  A detailed review of systems was otherwise stable   COVID 19 VACCINATION STATUS: s/p Pfizer x 2, most recently April 2021   HISTORY OF CURRENT ILLNESS: From Dr. Geralyn Flash original intake note:   "Catherine Santos 50 y.o. female is here because of recent diagnosis of right breast ductal carcinoma in situ. Screening mammogram on 02/08/20 detected right breast calcifications. Diagnostic mammogram on 02/23/20 showed a 0.9cm group of right breast calcifications. 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 Marlou Starks. Pathology from the procedure [id number]) showed: ductal carcinoma in situ, high grade, with necrosis; margins negative.  The patient's subsequent history is as detailed below.   PAST MEDICAL HISTORY: Past Medical History:  Diagnosis Date  . Abnormal Pap smear   . Cancer [facility])    breast cancer  . Dog bite 04/2017  . Family history of breast cancer 03/23/2020  . Family history of prostate cancer 03/23/2020    PAST SURGICAL HISTORY: Past Surgical History:  Procedure Laterality Date  . BREAST LUMPECTOMY WITH RADIOACTIVE SEED LOCALIZATION Right 04/27/2020   Procedure: RIGHT BREAST LUMPECTOMY WITH RADIOACTIVE SEED LOCALIZATION;  Surgeon: Jovita Kussmaul, MD;  Location: [place];  Service: General;  Laterality: Right;  . BREAST REDUCTION SURGERY Bilateral 04/27/2020   Procedure: ONCOPLASTIC BREAST BILATERAL REDUCTION;  Surgeon: Wallace Going, DO;  Location: [place];  Service: Plastics;   Laterality: Bilateral;  . CYSTOSCOPY  12/30/2011   Procedure: CYSTOSCOPY;  Surgeon: Lyman Speller, MD;  Location: [facility];  Service: Gynecology;  Laterality: N/A;  . ROBOTIC ASSISTED LAP VAGINAL HYSTERECTOMY    . SALPINGOOPHORECTOMY  12/30/2011   Procedure: SALPINGO OOPHERECTOMY;  Surgeon: Lyman Speller, MD;  Location: [facility];  Service: Gynecology;  Laterality: Left;  . TUBAL LIGATION      FAMILY HISTORY: Family History  Problem Relation Age of Onset  . Hypertension Mother   . Diabetes Mother   . Thyroid disease Mother   . Breast cancer Cousin        maternal cousin-1st , diag 33s  . Prostate cancer Maternal Uncle        dx unknown age  . Lung cancer Paternal Grandfather        dx 73s; smoking hx  . Cancer Maternal Uncle        unknown type; unknown age dx  . Colon cancer Maternal Uncle        dx 39s  . Cancer Cousin        maternal cousin; GYN cancer, unknown age dx  As of November 2021 the patient's mother is 20 years old with no cancer history.  A maternal uncle had prostate cancer and another colon cancer.  A third had an unknown type of cancer.  A maternal cousin had breast cancer in her 78s and a second maternal cousin had an unknown gynecologic cancer.  The patient's father is 49 years old as of November 2021.  The paternal grandfather died from lung cancer at an advanced age.  There is no other family history of cancer to the patient's knowledge.   GYNECOLOGIC HISTORY:  Patient's last menstrual period was 11/17/2011. Menarche: 50 years old Age at first live birth: 50 years old [place] P 1 LMP status post hysterectomy Contraceptive remotely, without complications HRT N/A  Hysterectomy? yes BSO? 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 She is single.  Her son Dorothea Ogle, 64 years old, is a Engineer, building services and his wife is in medical school in [place] where her son is planning to move soon.  The  patient has no grandchildren.  She lives by herself with two boxers.  She is not a church attender    ADVANCED DIRECTIVES: Not in place.  She tells me she intends to name her son Dorothea Ogle as healthcare power of attorney.  At the 06/07/2020 visit she was given the appropriate documents to complete and notarize at her discretion.   HEALTH MAINTENANCE: Social History   Tobacco Use  . Smoking status: Former Smoker    Types: Cigarettes  . Smokeless tobacco: Never Used  Vaping Use  . Vaping Use: Never used  Substance Use Topics  . Alcohol use: Not Currently    Alcohol/week: 2.0 - 3.0 standard drinks    Types: 2 - 3 Standard drinks or equivalent per week    Comment: on weekends  . Drug use: No     Colonoscopy: Cologard 01/2020 negative (performed at [place])  PAP:  (s/p hysterectomy)  Bone density: n/a (age)   Allergies  Allergen Reactions  . Phentermine Palpitations  .  Penicillins Other (See Comments)    Pt doesn't remember reaction to PCN- was in childhood  . Sulfa Antibiotics Rash    Current Outpatient Medications  Medication Sig Dispense Refill  . Ascorbic Acid (VITAMIN C) 500 MG CAPS Take 2 capsules by mouth daily.    [place] Kitchen CALCIUM PO Take by mouth.    . Cholecalciferol (D3-1000) 25 MCG (1000 UT) tablet Take 1,000 Units by mouth daily. 2 tablets daily    . ELDERBERRY PO Take by mouth. + vitamin c, zinc    . Multiple Vitamin (MULTIVITAMIN PO) Take by mouth.    . tamoxifen (NOLVADEX) 20 MG tablet Take 1 tablet (20 mg total) by mouth daily. 30 tablet 3   No current facility-administered medications for this visit.    OBJECTIVE: White woman who appears well  There were no vitals filed for this visit.   There is no height or weight on file to calculate BMI.   Wt Readings from Last 3 Encounters:  06/07/20 200 lb 6.4 oz (90.9 kg)  05/11/20 202 lb 8 oz (91.9 kg)  04/27/20 201 lb 4.5 oz (91.3 kg)      ECOG FS:1 - Symptomatic but completely ambulatory  Telemedicine visit  09/24/2020   LAB RESULTS:  CMP     Component Value Date/Time   NA 137 02/10/2019 0920   K 4.4 02/10/2019 0920   CL 101 02/10/2019 0920   CO2 20 02/10/2019 0920   GLUCOSE 96 02/10/2019 0920   GLUCOSE 106 (H) 08/05/2016 1422   BUN 13 02/10/2019 0920   CREATININE 0.71 02/10/2019 0920   CREATININE 0.83 08/05/2016 1422   CALCIUM 9.4 02/10/2019 0920   PROT 7.2 02/10/2019 0920   ALBUMIN 4.7 02/10/2019 0920   AST 18 02/10/2019 0920   ALT 17 02/10/2019 0920   ALKPHOS 65 02/10/2019 0920   BILITOT 0.3 02/10/2019 0920   GFRNONAA 102 02/10/2019 0920   GFRAA 117 02/10/2019 0920    No results found for: TOTALPROTELP, ALBUMINELP, A1GS, A2GS, BETS, BETA2SER, GAMS, MSPIKE, SPEI  Lab Results  Component Value Date   WBC 6.5 02/10/2019   NEUTROABS 4.6 11/03/2017   HGB 13.7 02/10/2019   HCT 41.1 02/10/2019   MCV 91 02/10/2019   PLT 306 02/10/2019    No results found for: LABCA2  No components found for: JQBHAL937  No results for input(s): INR in the last 168 hours.  No results found for: LABCA2  No results found for: TKW409  No results found for: BDZ329  No results found for: JME268  No results found for: CA2729  No components found for: HGQUANT  No results found for: CEA1 / No results found for: CEA1   No results found for: AFPTUMOR  No results found for: CHROMOGRNA  No results found for: KPAFRELGTCHN, LAMBDASER, KAPLAMBRATIO (kappa/lambda light chains)  No results found for: HGBA, HGBA2QUANT, HGBFQUANT, HGBSQUAN (Hemoglobinopathy evaluation)   No results found for: LDH  No results found for: IRON, TIBC, IRONPCTSAT (Iron and TIBC)  No results found for: FERRITIN  Urinalysis    Component Value Date/Time   BILIRUBINUR N 11/03/2017 1505   PROTEINUR N 11/03/2017 1505   UROBILINOGEN 0.2 11/03/2017 1505   NITRITE N 11/03/2017 1505   LEUKOCYTESUR Small (1+) (A) 11/03/2017 1505    STUDIES: No results found.   ELIGIBLE FOR AVAILABLE RESEARCH PROTOCOL:  AET  ASSESSMENT: 50 y.o. Catherine Santos woman status post right breast biopsy 03/07/2020 for ductal carcinoma in situ, intermediate to high-grade, estrogen and progesterone receptor positive.  (1)  genetics testing 03/28/2020 through the [facility] Common Hereditary Cancers Panel found no deleterious mutations in APC, ATM, AXIN2, BARD1, BMPR1A, BRCA1, BRCA2, BRIP1, CDH1, CDK4, CDKN2A (p14ARF), CDKN2A (p16INK4a), CHEK2, CTNNA1, DICER1, EPCAM (Deletion/duplication testing only), GREM1 (promoter region deletion/duplication testing only), KIT, MEN1, MLH1, MSH2, MSH3, MSH6, MUTYH, NBN, NF1, NHTL1, PALB2, PDGFRA, PMS2, POLD1, POLE, PTEN, RAD50, RAD51C, RAD51D, RNF43, SDHB, SDHC, SDHD, SMAD4, SMARCA4. STK11, TP53, TSC1, TSC2, and VHL.  The following genes were evaluated for sequence changes only: SDHA and HOXB13 c.251G>A variant only.  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  (2) status post right lumpectomy 04/27/2020 for ductal carcinoma in situ, high-grade, with negative margins.  (a) status post bilateral breast reduction 04/27/2020  (3) adjuvant radiation 06/05/2020 through 07/05/2020 Site Technique Total Dose (Gy) Dose per Fx (Gy) Completed Fx Beam Energies  Breast, Right: Breast_Rt 3D 40.05/40.05 2.67 15/15 10X  Breast, Right: Breast_Rt_Bst 3D 10/10 2 5/5 6X, 10X   (4) to start tamoxifen 07/24/2020   PLAN: Catherine Santos is close to half a year out from definitive surgery for her breast cancer.  She is making an excellent recovery.  We discussed the fact that at menopause there is a 15 to 20 pound average weight gain.  She is doing exactly the right things by eating fewer carbs and increasing her activity.  She is tolerating tamoxifen well and the plan will be to continue that a total of 5 years.  She will have her new baseline mammogram in May.  She will see Dr. Marlou Starks in May.  She will see Dr. Marla Roe in August.  She will see me again in October  She knows to call for any  other concern that may develop before the next visit.  Virgie Dad. Catherine Ourada, MD 09/24/2020 1:32 PM Medical Oncology and Hematology [facility] [place], [place] [address] Tel. [phone]    Fax. [phone]   This document serves as a record of services personally performed by Lurline Del, MD. It was created on his behalf by Wilburn Mylar, a trained medical scribe. The creation of this record is based on the scribe's personal observations and the provider's statements to them.   I, Lurline Del MD, have reviewed the above documentation for accuracy and completeness, and I agree with the above.   *Total Encounter Time as defined by the Centers for Medicare and Medicaid Services includes, in addition to the face-to-face time of a patient visit (documented in the note above) non-face-to-face time: obtaining and reviewing outside history, ordering and reviewing medications, tests or procedures, care coordination (communications with other health care professionals or caregivers) and documentation in the medical record.

## 2020-09-24 ENCOUNTER — Inpatient Hospital Stay: Payer: BC Managed Care – PPO | Attending: Oncology | Admitting: Oncology

## 2020-09-24 DIAGNOSIS — D0511 Intraductal carcinoma in situ of right breast: Secondary | ICD-10-CM | POA: Diagnosis not present

## 2020-11-13 DIAGNOSIS — D0511 Intraductal carcinoma in situ of right breast: Secondary | ICD-10-CM | POA: Diagnosis not present

## 2021-01-09 ENCOUNTER — Ambulatory Visit: Payer: BC Managed Care – PPO | Admitting: Plastic Surgery

## 2021-01-18 DIAGNOSIS — E669 Obesity, unspecified: Secondary | ICD-10-CM | POA: Diagnosis not present

## 2021-01-18 DIAGNOSIS — Z8619 Personal history of other infectious and parasitic diseases: Secondary | ICD-10-CM | POA: Diagnosis not present

## 2021-01-23 DIAGNOSIS — U071 COVID-19: Secondary | ICD-10-CM | POA: Diagnosis not present

## 2021-01-28 ENCOUNTER — Other Ambulatory Visit: Payer: Self-pay

## 2021-01-28 MED ORDER — TAMOXIFEN CITRATE 20 MG PO TABS
20.0000 mg | ORAL_TABLET | Freq: Every day | ORAL | 3 refills | Status: DC
Start: 2021-01-28 — End: 2021-07-09

## 2021-01-28 NOTE — Telephone Encounter (Signed)
Pt called in for refill on Tamoxifen. Refill submitted. Pt aware and verbalized thanks.

## 2021-02-07 DIAGNOSIS — Z853 Personal history of malignant neoplasm of breast: Secondary | ICD-10-CM | POA: Diagnosis not present

## 2021-02-07 DIAGNOSIS — E6609 Other obesity due to excess calories: Secondary | ICD-10-CM | POA: Diagnosis not present

## 2021-02-07 DIAGNOSIS — Z131 Encounter for screening for diabetes mellitus: Secondary | ICD-10-CM | POA: Diagnosis not present

## 2021-02-07 DIAGNOSIS — Z6833 Body mass index (BMI) 33.0-33.9, adult: Secondary | ICD-10-CM | POA: Diagnosis not present

## 2021-02-08 ENCOUNTER — Ambulatory Visit
Admission: RE | Admit: 2021-02-08 | Discharge: 2021-02-08 | Disposition: A | Discharge status: Home / Self Care | Payer: BC Managed Care – PPO | Source: Ambulatory Visit | Attending: Oncology | Admitting: Oncology

## 2021-02-08 ENCOUNTER — Other Ambulatory Visit: Payer: Self-pay

## 2021-02-08 DIAGNOSIS — D0511 Intraductal carcinoma in situ of right breast: Secondary | ICD-10-CM

## 2021-02-08 DIAGNOSIS — R922 Inconclusive mammogram: Secondary | ICD-10-CM | POA: Diagnosis not present

## 2021-02-28 DIAGNOSIS — R300 Dysuria: Secondary | ICD-10-CM | POA: Diagnosis not present

## 2021-03-29 DIAGNOSIS — Z113 Encounter for screening for infections with a predominantly sexual mode of transmission: Secondary | ICD-10-CM | POA: Diagnosis not present

## 2021-03-29 DIAGNOSIS — H6122 Impacted cerumen, left ear: Secondary | ICD-10-CM | POA: Diagnosis not present

## 2021-03-29 DIAGNOSIS — Z23 Encounter for immunization: Secondary | ICD-10-CM | POA: Diagnosis not present

## 2021-03-29 DIAGNOSIS — Z0000 Encounter for general adult medical examination without abnormal findings: Secondary | ICD-10-CM | POA: Diagnosis not present

## 2021-03-29 DIAGNOSIS — E669 Obesity, unspecified: Secondary | ICD-10-CM | POA: Diagnosis not present

## 2021-04-02 DIAGNOSIS — Z853 Personal history of malignant neoplasm of breast: Secondary | ICD-10-CM | POA: Diagnosis not present

## 2021-04-02 DIAGNOSIS — E6609 Other obesity due to excess calories: Secondary | ICD-10-CM | POA: Diagnosis not present

## 2021-04-02 DIAGNOSIS — Z6833 Body mass index (BMI) 33.0-33.9, adult: Secondary | ICD-10-CM | POA: Diagnosis not present

## 2021-04-04 DIAGNOSIS — E6609 Other obesity due to excess calories: Secondary | ICD-10-CM | POA: Diagnosis not present

## 2021-04-04 DIAGNOSIS — Z6833 Body mass index (BMI) 33.0-33.9, adult: Secondary | ICD-10-CM | POA: Diagnosis not present

## 2021-04-04 DIAGNOSIS — Z713 Dietary counseling and surveillance: Secondary | ICD-10-CM | POA: Diagnosis not present

## 2021-04-09 ENCOUNTER — Other Ambulatory Visit: Payer: BC Managed Care – PPO

## 2021-04-09 ENCOUNTER — Ambulatory Visit: Payer: BC Managed Care – PPO | Admitting: Oncology

## 2021-04-22 ENCOUNTER — Encounter (HOSPITAL_BASED_OUTPATIENT_CLINIC_OR_DEPARTMENT_OTHER): Payer: Self-pay | Admitting: Obstetrics & Gynecology

## 2021-04-22 ENCOUNTER — Other Ambulatory Visit: Payer: Self-pay | Admitting: *Deleted

## 2021-04-22 ENCOUNTER — Other Ambulatory Visit: Payer: Self-pay

## 2021-04-22 ENCOUNTER — Inpatient Hospital Stay: Payer: BC Managed Care – PPO | Admitting: Oncology

## 2021-04-22 ENCOUNTER — Inpatient Hospital Stay: Payer: BC Managed Care – PPO | Attending: Oncology

## 2021-04-22 ENCOUNTER — Ambulatory Visit (INDEPENDENT_AMBULATORY_CARE_PROVIDER_SITE_OTHER): Payer: BC Managed Care – PPO | Admitting: Obstetrics & Gynecology

## 2021-04-22 VITALS — BP 126/93 | HR 80 | Ht 65.25 in | Wt 204.8 lb

## 2021-04-22 VITALS — BP 136/90 | HR 93 | Temp 97.5°F | Resp 18 | Wt 208.1 lb

## 2021-04-22 DIAGNOSIS — D0511 Intraductal carcinoma in situ of right breast: Secondary | ICD-10-CM

## 2021-04-22 DIAGNOSIS — B009 Herpesviral infection, unspecified: Secondary | ICD-10-CM | POA: Diagnosis not present

## 2021-04-22 DIAGNOSIS — Z01419 Encounter for gynecological examination (general) (routine) without abnormal findings: Secondary | ICD-10-CM | POA: Diagnosis not present

## 2021-04-22 DIAGNOSIS — Z923 Personal history of irradiation: Secondary | ICD-10-CM | POA: Insufficient documentation

## 2021-04-22 DIAGNOSIS — Z803 Family history of malignant neoplasm of breast: Secondary | ICD-10-CM

## 2021-04-22 DIAGNOSIS — Z90710 Acquired absence of both cervix and uterus: Secondary | ICD-10-CM | POA: Diagnosis not present

## 2021-04-22 MED ORDER — VALACYCLOVIR HCL 1 G PO TABS
ORAL_TABLET | ORAL | 4 refills | Status: DC
Start: 2021-04-22 — End: 2023-10-14

## 2021-04-22 NOTE — Progress Notes (Signed)
[facility]  [phone]) [phone] Fax:([phone]) [phone]     ID: JING HOWATT DOB: Jul 04, 1970  MR#: [id number]  OHY#:[id number]  Patient Care Team: Willeen Niece, PA as PCP - General (Physician Assistant) Mauro Kaufmann, RN as Oncology Nurse Navigator Rockwell Germany, RN as Oncology Nurse Navigator Davianna Deutschman, Virgie Dad, MD as Consulting Physician (Oncology) Jovita Kussmaul, MD as Consulting Physician (General Surgery) Eppie Gibson, MD as Attending Physician (Radiation Oncology) Megan Salon, MD as Consulting Physician (Gynecology) Dillingham, Loel Lofty, DO as Attending Physician (Plastic Surgery) Chauncey Cruel, MD OTHER MD:   CHIEF COMPLAINT: noninvasive breast cancer, estrogen receptor positive  CURRENT TREATMENT: Tamoxifen   INTERVAL HISTORY: Catherine Santos returns today for follow up of her noninvasive breast cancer.  She began tamoxifen on 07/24/2020.  Hot flashes are better.  She has been referred to a weight loss program and has already lost 4 pounds (her goal is in the 160s).  She is not having any other side effects that she is aware of  Since her last visit, she underwent bilateral diagnostic mammography with tomography at [facility] on 02/08/21 showing: breast density category C; no evidence of malignancy in either breast.    REVIEW OF SYSTEMS: Catherine Santos is exercising regularly.  She had a friend go to the gym at least once a week and she is also doing other cardio exercises.  She has more energy and feels better.  She also has a lot less stress at work as since a second person was hired to take some of her load off.  She tells me her daughter-in-law who is a medical school is currently doing psychiatry and her son is in [place] living with her.  She is planning to stay in this area.  Detailed review of systems was otherwise stable   COVID 19 VACCINATION STATUS: s/p Pfizer x 3 as of October 2022   HISTORY OF CURRENT ILLNESS: From Dr. Geralyn Flash original  intake note:   "Catherine Santos 50 y.o. female is here because of recent diagnosis of right breast ductal carcinoma in situ. Screening mammogram on 02/08/20 detected right breast calcifications. Diagnostic mammogram on 02/23/20 showed a 0.9cm group of right breast calcifications. 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 Marlou Starks. Pathology from the procedure [id number]) showed: ductal carcinoma in situ, high grade, with necrosis; margins negative.  The patient's subsequent history is as detailed below.   PAST MEDICAL HISTORY: Past Medical History:  Diagnosis Date   Abnormal Pap smear    Cancer ([place])    breast cancer   Dog bite 04/2017   Family history of breast cancer 03/23/2020   Family history of prostate cancer 03/23/2020    PAST SURGICAL HISTORY: Past Surgical History:  Procedure Laterality Date   BREAST LUMPECTOMY WITH RADIOACTIVE SEED LOCALIZATION Right 04/27/2020   Procedure: RIGHT BREAST LUMPECTOMY WITH RADIOACTIVE SEED LOCALIZATION;  Surgeon: Jovita Kussmaul, MD;  Location: [facility];  Service: General;  Laterality: Right;   BREAST REDUCTION SURGERY Bilateral 04/27/2020   Procedure: ONCOPLASTIC BREAST BILATERAL REDUCTION;  Surgeon: Wallace Going, DO;  Location: [facility];  Service: Plastics;  Laterality: Bilateral;   CYSTOSCOPY  12/30/2011   Procedure: CYSTOSCOPY;  Surgeon: Lyman Speller, MD;  Location: [facility];  Service: Gynecology;  Laterality: N/A;   ROBOTIC ASSISTED LAP VAGINAL HYSTERECTOMY     SALPINGOOPHORECTOMY  12/30/2011  Procedure: SALPINGO OOPHERECTOMY;  Surgeon: Lyman Speller, MD;  Location: [facility];  Service: Gynecology;  Laterality: Left;   TUBAL LIGATION      FAMILY HISTORY: Family History  Problem Relation Age of Onset   Hypertension Mother    Diabetes Mother    Thyroid disease Mother    Breast cancer Cousin         maternal cousin-1st , diag 23s   Prostate cancer Maternal Uncle        dx unknown age   Lung cancer Paternal Grandfather        dx 17s; smoking hx   Cancer Maternal Uncle        unknown type; unknown age dx   Colon cancer Maternal Uncle        dx 2s   Cancer Cousin        maternal cousin; GYN cancer, unknown age dx  As of November 2021 the patient's mother is 24 years old with no cancer history.  A maternal uncle had prostate cancer and another colon cancer.  A third had an unknown type of cancer.  A maternal cousin had breast cancer in her 75s and a second maternal cousin had an unknown gynecologic cancer.  The patient's father is 35 years old as of November 2021.  The paternal grandfather died from lung cancer at an advanced age.  There is no other family history of cancer to the patient's knowledge.   GYNECOLOGIC HISTORY:  Patient's last menstrual period was 11/17/2011. Menarche: 50 years old Age at first live birth: 50 years old [place] P 1 LMP status post hysterectomy Contraceptive remotely, without complications HRT N/A  Hysterectomy? yes BSO? 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 She is single.  [name], 49 years old, is a Engineer, building services and his wife is in medical school in [place] where her son is planning to move soon.  The patient has no grandchildren.  She lives by herself with two boxers.  She is not a church attender    ADVANCED DIRECTIVES: Not in place.  She tells me she intends to name [name] as healthcare power of attorney.  At the 06/07/2020 visit she was given the appropriate documents to complete and notarize at her discretion.   HEALTH MAINTENANCE: Social History   Tobacco Use   Smoking status: Former    Types: Cigarettes   Smokeless tobacco: Never  Vaping Use   Vaping Use: Never used  Substance Use Topics   Alcohol use: Not Currently    Alcohol/week: 2.0 - 3.0  standard drinks    Types: 2 - 3 Standard drinks or equivalent per week    Comment: on weekends   Drug use: No     Colonoscopy: Cologard 01/2020 negative (performed at [facility])  PAP:  (s/p hysterectomy)  Bone density: n/a (age)   Allergies  Allergen Reactions   Phentermine Palpitations   Penicillins Other (See Comments)    Pt doesn't remember reaction to PCN- was in childhood   Sulfa Antibiotics Rash    Current Outpatient Medications  Medication Sig Dispense Refill   Cholecalciferol (D3-1000) 25 MCG (1000 UT) tablet Take 1,000 Units by mouth daily. 2 tablets daily     Multiple Vitamin (MULTIVITAMIN PO) Take by mouth.     SAXENDA 18 MG/3ML SOPN Inject 3 mg into the skin daily.     tamoxifen (NOLVADEX) 20 MG tablet Take  1 tablet (20 mg total) by mouth daily. 30 tablet 3   valACYclovir (VALTREX) 1000 MG tablet With symptom onset, take 2 tablets (two grams) and repeat in 12 hours. 30 tablet 4   No current facility-administered medications for this visit.    OBJECTIVE: White woman who appears well  Vitals:   04/22/21 1533  BP: 136/90  Pulse: 93  Resp: 18  Temp: (!) 97.5 F (36.4 C)  SpO2: 98%     Body mass index is 34.36 kg/m.   Wt Readings from Last 3 Encounters:  04/22/21 208 lb 1.6 oz (94.4 kg)  04/22/21 204 lb 12.8 oz (92.9 kg)  06/07/20 200 lb 6.4 oz (90.9 kg)      ECOG FS:1 - Symptomatic but completely ambulatory  Sclerae unicteric, EOMs intact Wearing a mask No cervical or supraclavicular adenopathy Lungs no rales or rhonchi Heart regular rate and rhythm Abd soft, nontender, positive bowel sounds MSK no focal spinal tenderness, no upper extremity lymphedema Neuro: nonfocal, well oriented, appropriate affect Breasts: The right breast is status postlumpectomy and radiation.  The cosmetic result is very good.  There is no evidence of local recurrence.  Left breast and both axillae are benign.   LAB RESULTS:  CMP     Component Value Date/Time   NA 137  02/10/2019 0920   K 4.4 02/10/2019 0920   CL 101 02/10/2019 0920   CO2 20 02/10/2019 0920   GLUCOSE 96 02/10/2019 0920   GLUCOSE 106 (H) 08/05/2016 1422   BUN 13 02/10/2019 0920   CREATININE 0.71 02/10/2019 0920   CREATININE 0.83 08/05/2016 1422   CALCIUM 9.4 02/10/2019 0920   PROT 7.2 02/10/2019 0920   ALBUMIN 4.7 02/10/2019 0920   AST 18 02/10/2019 0920   ALT 17 02/10/2019 0920   ALKPHOS 65 02/10/2019 0920   BILITOT 0.3 02/10/2019 0920   GFRNONAA 102 02/10/2019 0920   GFRAA 117 02/10/2019 0920    No results found for: TOTALPROTELP, ALBUMINELP, A1GS, A2GS, BETS, BETA2SER, GAMS, MSPIKE, SPEI  Lab Results  Component Value Date   WBC 6.5 02/10/2019   NEUTROABS 4.6 11/03/2017   HGB 13.7 02/10/2019   HCT 41.1 02/10/2019   MCV 91 02/10/2019   PLT 306 02/10/2019    No results found for: LABCA2  No components found for: [id number]  No results for input(s): INR in the last 168 hours.  No results found for: LABCA2  No results found for: VXB939  No results found for: QZE092  No results found for: ZRA076  No results found for: CA2729  No components found for: HGQUANT  No results found for: CEA1 / No results found for: CEA1   No results found for: AFPTUMOR  No results found for: CHROMOGRNA  No results found for: KPAFRELGTCHN, LAMBDASER, KAPLAMBRATIO (kappa/lambda light chains)  No results found for: HGBA, HGBA2QUANT, HGBFQUANT, HGBSQUAN (Hemoglobinopathy evaluation)   No results found for: LDH  No results found for: IRON, TIBC, IRONPCTSAT (Iron and TIBC)  No results found for: FERRITIN  Urinalysis    Component Value Date/Time   BILIRUBINUR N 11/03/2017 1505   PROTEINUR N 11/03/2017 1505   UROBILINOGEN 0.2 11/03/2017 1505   NITRITE N 11/03/2017 1505   LEUKOCYTESUR Small (1+) (A) 11/03/2017 1505    STUDIES: No results found.   ELIGIBLE FOR AVAILABLE RESEARCH PROTOCOL: AET  ASSESSMENT: 50 y.o. Catherine Santos woman status post right breast biopsy  03/07/2020 for ductal carcinoma in situ, intermediate to high-grade, estrogen and progesterone receptor positive.  (1) genetics testing  03/28/2020 through the [facility] Common Hereditary Cancers Panel found no deleterious mutations in APC, ATM, AXIN2, BARD1, BMPR1A, BRCA1, BRCA2, BRIP1, CDH1, CDK4, CDKN2A (p14ARF), CDKN2A (p16INK4a), CHEK2, CTNNA1, DICER1, EPCAM (Deletion/duplication testing only), GREM1 (promoter region deletion/duplication testing only), KIT, MEN1, MLH1, MSH2, MSH3, MSH6, MUTYH, NBN, NF1, NHTL1, PALB2, PDGFRA, PMS2, POLD1, POLE, PTEN, RAD50, RAD51C, RAD51D, RNF43, SDHB, SDHC, SDHD, SMAD4, SMARCA4. STK11, TP53, TSC1, TSC2, and VHL.  The following genes were evaluated for sequence changes only: SDHA and HOXB13 c.251G>A variant only.  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  (2) status post right lumpectomy 04/27/2020 for ductal carcinoma in situ, high-grade, with negative margins.  (a) status post bilateral breast reduction 04/27/2020  (3) adjuvant radiation 06/05/2020 through 07/05/2020 Site Technique Total Dose (Gy) Dose per Fx (Gy) Completed Fx Beam Energies  Breast, Right: Breast_Rt 3D 40.05/40.05 2.67 15/15 10X  Breast, Right: Breast_Rt_Bst 3D 10/10 2 5/5 6X, 10X   (4) started tamoxifen 07/24/2020   PLAN: Catherine Santos is just about a year out from definitive surgery for her breast cancer with no evidence of disease recurrence.  This is very favorable.  She is tolerating tamoxifen well and the plan is to continue that a minimum of 5 years.  It turns out she sees all her other doctors in October.  Accordingly she will see is in April and after her April 2023 visit likely we will see her on a once a year basis until she completes her 5 years of follow-up  Total encounter time 20 minutes.Catherine Jews C. Sevyn Markham, MD 04/22/2021 4:20 PM Medical Oncology and Hematology [facility] [place], [place] [address] Tel. [phone]     Fax. [phone]   This document serves as a record of services personally performed by Lurline Del, MD. It was created on his behalf by Wilburn Mylar, a trained medical scribe. The creation of this record is based on the scribe's personal observations and the provider's statements to them.   I, Lurline Del MD, have reviewed the above documentation for accuracy and completeness, and I agree with the above.   *Total Encounter Time as defined by the Centers for Medicare and Medicaid Services includes, in addition to the face-to-face time of a patient visit (documented in the note above) non-face-to-face time: obtaining and reviewing outside history, ordering and reviewing medications, tests or procedures, care coordination (communications with other health care professionals or caregivers) and documentation in the medical record.

## 2021-04-22 NOTE — Progress Notes (Signed)
50 y.o. G2P1 Single White or Caucasian female here for annual exam.  H/o breast cancer last year.  Had lumpectomy and then reduction to be more even.  Had radiation and is now on tamoxifen for five years.  Her genetic testing was negative.    Denies vaginal bleeding.    Seeing Dr. Jana Hakim this afternoon.    Patient's last menstrual period was 11/17/2011.          Sexually active: Yes.    The current method of family planning is status post hysterectomy.    Exercising: yes, going to the gym Smoker:  no  Health Maintenance: Pap:  not indicated History of abnormal Pap:  remote hx MMG:  02/08/21 benign Cologuard: 02/19/20 neg Screening Labs: done 03/29/2021, reviewed in care everywhere   reports that she has quit smoking. Her smoking use included cigarettes. She has never used smokeless tobacco. She reports that she does not currently use alcohol after a past usage of about 2.0 - 3.0 standard drinks per week. She reports that she does not use drugs.  Past Medical History:  Diagnosis Date   Abnormal Pap smear    Cancer ([place])    breast cancer   Dog bite 04/2017   Family history of breast cancer 03/23/2020   Family history of prostate cancer 03/23/2020    Past Surgical History:  Procedure Laterality Date   BREAST LUMPECTOMY WITH RADIOACTIVE SEED LOCALIZATION Right 04/27/2020   Procedure: RIGHT BREAST LUMPECTOMY WITH RADIOACTIVE SEED LOCALIZATION;  Surgeon: Jovita Kussmaul, MD;  Location: [facility];  Service: General;  Laterality: Right;   BREAST REDUCTION SURGERY Bilateral 04/27/2020   Procedure: ONCOPLASTIC BREAST BILATERAL REDUCTION;  Surgeon: Wallace Going, DO;  Location: [facility];  Service: Plastics;  Laterality: Bilateral;   CYSTOSCOPY  12/30/2011   Procedure: CYSTOSCOPY;  Surgeon: Lyman Speller, MD;  Location: [facility];  Service: Gynecology;  Laterality: N/A;   ROBOTIC ASSISTED LAP VAGINAL HYSTERECTOMY     SALPINGOOPHORECTOMY  12/30/2011    Procedure: SALPINGO OOPHERECTOMY;  Surgeon: Lyman Speller, MD;  Location: [facility];  Service: Gynecology;  Laterality: Left;   TUBAL LIGATION      Current Outpatient Medications  Medication Sig Dispense Refill   Cholecalciferol (D3-1000) 25 MCG (1000 UT) tablet Take 1,000 Units by mouth daily. 2 tablets daily     Multiple Vitamin (MULTIVITAMIN PO) Take by mouth.     SAXENDA 18 MG/3ML SOPN Inject 3 mg into the skin daily.     tamoxifen (NOLVADEX) 20 MG tablet Take 1 tablet (20 mg total) by mouth daily. 30 tablet 3   valACYclovir (VALTREX) 1000 MG tablet Take 1,000 mg by mouth 2 (two) times daily as needed.     No current facility-administered medications for this visit.    Family History  Problem Relation Age of Onset   Hypertension Mother    Diabetes Mother    Thyroid disease Mother    Breast cancer Cousin        maternal cousin-1st , diag 60s   Prostate cancer Maternal Uncle        dx unknown age   Lung cancer Paternal Grandfather        dx 56s; smoking hx   Cancer Maternal Uncle        unknown type; unknown age dx   Colon cancer Maternal Uncle        dx 67s   Cancer Cousin        maternal cousin;  GYN cancer, unknown age dx    Review of Systems  All other systems reviewed and are negative.  Exam:   BP (!) 126/93   Pulse 80   Ht 5' 5.25" (1.657 m)   Wt 204 lb 12.8 oz (92.9 kg)   LMP 11/17/2011   BMI 33.82 kg/m   Height: 5' 5.25" (165.7 cm)  General appearance: alert, cooperative and appears stated age Head: Normocephalic, without obvious abnormality, atraumatic Neck: no adenopathy, supple, symmetrical, trachea midline and thyroid normal to inspection and palpation Lungs: clear to auscultation bilaterally Breasts: normal appearance, no masses or tenderness Heart: regular rate and rhythm Abdomen: soft, non-tender; bowel sounds normal; no masses,  no organomegaly Extremities: extremities normal, atraumatic, no cyanosis or edema Skin: Skin color, texture, turgor  normal. No rashes or lesions Lymph nodes: Cervical, supraclavicular, and axillary nodes normal. No abnormal inguinal nodes palpated Neurologic: Grossly normal   Pelvic: External genitalia:  no lesions              Urethra:  normal appearing urethra with no masses, tenderness or lesions              Bartholins and Skenes: normal                 Vagina: normal appearing vagina with normal color and no discharge, no lesions              Cervix: absent              Pap taken: No. Bimanual Exam:  Uterus:  uterus absent              Adnexa: no mass, fullness, tenderness               Rectovaginal: Confirms               Anus:  normal sphincter tone, no lesions  Chaperone, Ezekiel Ina, RN, was present for exam.  Assessment/Plan: 1. Well woman exam with routine gynecological exam - pap smear not indicated - MMG 01/2021 - Cologuard neg 2021 - BMD not indicated yet - lab work with PCP and oncology - care gaps updated/reviewed  2. 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  4. Family history of breast cancer  5. Breast neoplasm, Tis (DCIS), right - s/p lumpectomy, radiation and tamoxifen x 5 years - negative genetic testing

## 2021-07-09 ENCOUNTER — Other Ambulatory Visit: Payer: Self-pay | Admitting: *Deleted

## 2021-07-09 MED ORDER — TAMOXIFEN CITRATE 20 MG PO TABS: 20.0000 mg | ORAL_TABLET | Freq: Every day | ORAL | 3 refills | Status: DC

## 2021-08-06 ENCOUNTER — Encounter (HOSPITAL_COMMUNITY): Payer: Self-pay

## 2021-10-07 ENCOUNTER — Other Ambulatory Visit: Payer: Self-pay

## 2021-10-07 ENCOUNTER — Other Ambulatory Visit: Payer: Self-pay | Admitting: *Deleted

## 2021-10-07 DIAGNOSIS — D0511 Intraductal carcinoma in situ of right breast: Secondary | ICD-10-CM

## 2021-10-08 ENCOUNTER — Inpatient Hospital Stay: Payer: BC Managed Care – PPO | Attending: Hematology and Oncology

## 2021-10-08 ENCOUNTER — Other Ambulatory Visit: Payer: Self-pay

## 2021-10-08 ENCOUNTER — Encounter: Payer: Self-pay | Admitting: Hematology and Oncology

## 2021-10-08 ENCOUNTER — Inpatient Hospital Stay (HOSPITAL_BASED_OUTPATIENT_CLINIC_OR_DEPARTMENT_OTHER): Payer: BC Managed Care – PPO | Admitting: Hematology and Oncology

## 2021-10-08 VITALS — BP 132/80 | HR 86 | Temp 97.9°F | Resp 16 | Ht 65.0 in | Wt 214.7 lb

## 2021-10-08 DIAGNOSIS — Z923 Personal history of irradiation: Secondary | ICD-10-CM | POA: Diagnosis not present

## 2021-10-08 DIAGNOSIS — Z79810 Long term (current) use of selective estrogen receptor modulators (SERMs): Secondary | ICD-10-CM | POA: Diagnosis not present

## 2021-10-08 DIAGNOSIS — D0511 Intraductal carcinoma in situ of right breast: Secondary | ICD-10-CM

## 2021-10-08 DIAGNOSIS — E781 Pure hyperglyceridemia: Secondary | ICD-10-CM | POA: Diagnosis not present

## 2021-10-08 LAB — CMP (CANCER CENTER ONLY)
ALT: 16 U/L (ref 0–44)
AST: 15 U/L (ref 15–41)
Albumin: 4.1 g/dL (ref 3.5–5.0)
Alkaline Phosphatase: 72 U/L (ref 38–126)
Anion gap: 7 (ref 5–15)
BUN: 12 mg/dL (ref 6–20)
CO2: 24 mmol/L (ref 22–32)
Calcium: 9 mg/dL (ref 8.9–10.3)
Chloride: 107 mmol/L (ref 98–111)
Creatinine: 0.76 mg/dL (ref 0.44–1.00)
GFR, Estimated: >60 mL/min (ref >60)
Glucose, Bld: 99 mg/dL (ref 70–99)
Potassium: 4.1 mmol/L (ref 3.5–5.1)
Sodium: 138 mmol/L (ref 135–145)
Total Bilirubin: 0.2 mg/dL — ABNORMAL LOW (ref 0.3–1.2)
Total Protein: 7 g/dL (ref 6.5–8.1)

## 2021-10-08 LAB — CBC WITH DIFFERENTIAL (CANCER CENTER ONLY)
Abs Immature Granulocytes: 0.02 10*3/uL (ref 0.00–0.07)
Basophils Absolute: 0 10*3/uL (ref 0.0–0.1)
Basophils Relative: 1 %
Eosinophils Absolute: 0.1 10*3/uL (ref 0.0–0.5)
Eosinophils Relative: 2 %
HCT: 34.7 % — ABNORMAL LOW (ref 36.0–46.0)
Hemoglobin: 12.8 g/dL (ref 12.0–15.0)
Immature Granulocytes: 0 %
Lymphocytes Relative: 34 %
Lymphs Abs: 2.1 10*3/uL (ref 0.7–4.0)
MCH: 32.2 pg (ref 26.0–34.0)
MCHC: 36.9 g/dL — ABNORMAL HIGH (ref 30.0–36.0)
MCV: 87.4 fL (ref 80.0–100.0)
Monocytes Absolute: 0.4 10*3/uL (ref 0.1–1.0)
Monocytes Relative: 6 %
Neutro Abs: 3.5 10*3/uL (ref 1.7–7.7)
Neutrophils Relative %: 57 %
Platelet Count: 292 10*3/uL (ref 150–400)
RBC: 3.97 MIL/uL (ref 3.87–5.11)
RDW: 12.5 % (ref 11.5–15.5)
WBC Count: 6.1 10*3/uL (ref 4.0–10.5)
nRBC: 0 % (ref 0.0–0.2)

## 2021-10-08 NOTE — Progress Notes (Signed)
?[facility]  ?[phone]) [phone] [phone]  ? ? ? ?ID: Catherine Santos DOB: [birth year]  MR#: [id number]  JFH#:[id number] ? ?Patient Care Team: ?Willeen Niece, PA as PCP - General (Physician Assistant) ?Mauro Kaufmann, RN as Oncology Nurse Navigator ?Rockwell Germany, RN as Oncology Nurse Navigator ?Magrinat, Virgie Dad, MD (Inactive) as Consulting Physician (Oncology) ?Jovita Kussmaul, MD as Consulting Physician (General Surgery) ?Eppie Gibson, MD as Attending Physician (Radiation Oncology) ?Megan Salon, MD as Consulting Physician (Gynecology) ?Dillingham, Loel Lofty, DO as Attending Physician (Plastic Surgery) ?Benay Pike, MD ?OTHER MD: ? ? ?CHIEF COMPLAINT: noninvasive breast cancer, estrogen receptor positive ? ?CURRENT TREATMENT: Tamoxifen ? ? ?INTERVAL HISTORY: ? ?Catherine Santos returns today for follow up of her noninvasive breast cancer. ?She has been tolerating tamoxifen very well except for recent episode of severe hypertriglyceridemia with triglycerides over 500 noted.  She is very worried about this.   ?She also reports about 20 to 30 pound weight gain since the past 3 to 4 months. ?She denies any other new complaints. ?Rest of the pertinent 10 point ROS reviewed and negative. ? ?REVIEW OF SYSTEMS: ? ? COVID 19 VACCINATION STATUS: s/p Pfizer x 3 as of October 2022 ? ? ?HISTORY OF CURRENT ILLNESS: ?From Dr. Geralyn Flash original intake note:  ? ?"Catherine Santos 51 y.o. female is here because of recent diagnosis of right breast ductal carcinoma in situ. Screening mammogram on 02/08/20 detected right breast calcifications. Diagnostic mammogram on 02/23/20 showed a 0.9cm group of right breast calcifications. Biopsy on 03/07/20 showed ductal carcinoma in situ, grade 2-3, ER+ 80%, PR+ 50%." ? ?She underwent right lumpectomy on 04/27/2020 under Dr. Marlou Starks. Pathology from the procedure [id number]) showed: ductal carcinoma in situ, high grade, with necrosis; margins negative. ? ?The patient's  subsequent history is as detailed below. ? ? ?PAST MEDICAL HISTORY: ?Past Medical History:  ?Diagnosis Date  ? Abnormal Pap smear   ? Cancer [facility])   ? breast cancer  ? Dog bite 04/2017  ? Family history of breast cancer 03/23/2020  ? Family history of prostate cancer 03/23/2020  ? ? ?PAST SURGICAL HISTORY: ?Past Surgical History:  ?Procedure Laterality Date  ? BREAST LUMPECTOMY WITH RADIOACTIVE SEED LOCALIZATION Right 04/27/2020  ? Procedure: RIGHT BREAST LUMPECTOMY WITH RADIOACTIVE SEED LOCALIZATION;  Surgeon: Jovita Kussmaul, MD;  Location: [place];  Service: General;  Laterality: Right;  ? BREAST REDUCTION SURGERY Bilateral 04/27/2020  ? Procedure: ONCOPLASTIC BREAST BILATERAL REDUCTION;  Surgeon: Wallace Going, DO;  Location: [place];  Service: Plastics;  Laterality: Bilateral;  ? CYSTOSCOPY  12/30/2011  ? Procedure: CYSTOSCOPY;  Surgeon: Lyman Speller, MD;  Location: [facility];  Service: Gynecology;  Laterality: N/A;  ? ROBOTIC ASSISTED LAP VAGINAL HYSTERECTOMY    ? SALPINGOOPHORECTOMY  12/30/2011  ? Procedure: SALPINGO OOPHERECTOMY;  Surgeon: Lyman Speller, MD;  Location: [facility];  Service: Gynecology;  Laterality: Left;  ? TUBAL LIGATION    ? ? ?FAMILY HISTORY: ?Family History  ?Problem Relation Age of Onset  ? Hypertension Mother   ? Diabetes Mother   ? Thyroid disease Mother   ? Breast cancer Cousin   ?     maternal cousin-1st , diag 104s  ? Prostate cancer Maternal Uncle   ?     dx unknown age  ? Lung cancer Paternal Grandfather   ?     dx 90s; smoking hx  ? Cancer Maternal Uncle   ?  unknown type; unknown age dx  ? Colon cancer Maternal Uncle   ?     dx 35s  ? Cancer Cousin   ?     maternal cousin; GYN cancer, unknown age dx  ?As of November 2021 the patient's mother is 24 years old with no cancer history.  A maternal uncle had prostate cancer and another colon cancer.  A third had an unknown type of cancer.  A maternal cousin had breast cancer in her 75s and a  second maternal cousin had an unknown gynecologic cancer.  The patient's father is 71 years old as of November 2021.  The paternal grandfather died from lung cancer at an advanced age.  There is no other family history of cancer to the patient's knowledge. ? ? ?GYNECOLOGIC HISTORY:  ?Patient's last menstrual period was 11/17/2011. ?Menarche: 51 years old ?Age at first live birth: 51 years old ?GX P 1 ?LMP status post hysterectomy ?Contraceptive remotely, without complications ?HRT N/A  ?Hysterectomy? yes ?BSO? Only left ovary removed ? ? 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  Her son Dorothea Ogle, 53 years old, is a Engineer, building services and his wife is in medical school in [place] where her son is planning to move soon.  The patient has no grandchildren.  She lives by herself with two boxers.  She is not a church attender ?  ? ADVANCED DIRECTIVES: Not in place.  She tells me she intends to name her son Dorothea Ogle as healthcare power of attorney.  At the 06/07/2020 visit she was given the appropriate documents to complete and notarize at her discretion. ? ? ?HEALTH MAINTENANCE: ?Social History  ? ?Tobacco Use  ? Smoking status: Former  ?  Types: Cigarettes  ? Smokeless tobacco: Never  ?Vaping Use  ? Vaping Use: Never used  ?Substance Use Topics  ? Alcohol use: Not Currently  ?  Alcohol/week: 2.0 - 3.0 standard drinks  ?  Types: 2 - 3 Standard drinks or equivalent per week  ?  Comment: on weekends  ? Drug use: No  ? ? ? Colonoscopy: Cologard 01/2020 negative (performed at [facility]) ? PAP:  (s/p hysterectomy) ? Bone density: n/a (age) ?  ?Allergies  ?Allergen Reactions  ? Phentermine Palpitations  ? Penicillins Other (See Comments)  ?  Pt doesn't remember reaction to PCN- was in childhood  ? Sulfa Antibiotics Rash  ? ? ?Current Outpatient Medications  ?Medication Sig Dispense Refill  ? Cholecalciferol (D3-1000) 25 MCG (1000 UT) tablet Take 1,000 Units by mouth daily.  2 tablets daily    ? Multiple Vitamin (MULTIVITAMIN PO) Take by mouth.    ? SAXENDA 18 MG/3ML SOPN Inject 3 mg into the skin daily.    ? tamoxifen (NOLVADEX) 20 MG tablet Take 1 tablet (20 mg total) by mouth daily. 30 tablet 3  ? valACYclovir (VALTREX) 1000 MG tablet With symptom onset, take 2 tablets (two grams) and repeat in 12 hours. 30 tablet 4  ? ?No current facility-administered medications for this visit.  ? ? ?OBJECTIVE: White woman who appears well ? ?Vitals:  ? 10/08/21 1534  ?BP: 132/80  ?Pulse: 86  ?Resp: 16  ?Temp: 97.9 ?F (36.6 ?C)  ?SpO2: 98%  ?   Body mass index is 35.73 kg/m?.   ?Wt Readings from Last 3 Encounters:  ?10/08/21 214 lb 11.2 oz (97.4 kg)  ?04/22/21 208 lb 1.6 oz (94.4 kg)  ?04/22/21 204 lb 12.8 oz (  92.9 kg)  ? ? ?  ECOG FS:1 - Symptomatic but completely ambulatory ? ?Physical Exam ?Constitutional:   ?   Appearance: Normal appearance.  ?Chest:  ?   Comments: Bilateral breasts inspected.  Right breast status postlumpectomy.  No palpable masses or regional adenopathy.  Left breast normal to inspection and palpation.  No palpable masses or regional adenopathy. ?Musculoskeletal:     ?   General: Normal range of motion.  ?   Cervical back: Normal range of motion and neck supple. No rigidity.  ?Lymphadenopathy:  ?   Cervical: No cervical adenopathy.  ?Neurological:  ?   Mental Status: She is alert.  ? ? ? ? ?LAB RESULTS: ? ?CMP  ?   ?Component Value Date/Time  ? NA 137 02/10/2019 0920  ? K 4.4 02/10/2019 0920  ? CL 101 02/10/2019 0920  ? CO2 20 02/10/2019 0920  ? GLUCOSE 96 02/10/2019 0920  ? GLUCOSE 106 (H) 08/05/2016 1422  ? BUN 13 02/10/2019 0920  ? CREATININE 0.71 02/10/2019 0920  ? CREATININE 0.83 08/05/2016 1422  ? CALCIUM 9.4 02/10/2019 0920  ? PROT 7.2 02/10/2019 0920  ? ALBUMIN 4.7 02/10/2019 0920  ? AST 18 02/10/2019 0920  ? ALT 17 02/10/2019 0920  ? ALKPHOS 65 02/10/2019 0920  ? BILITOT 0.3 02/10/2019 0920  ? GFRNONAA 102 02/10/2019 0920  ? GFRAA 117 02/10/2019 0920  ? ? ?No results  found for: TOTALPROTELP, ALBUMINELP, A1GS, A2GS, BETS, BETA2SER, GAMS, MSPIKE, SPEI ? ?Lab Results  ?Component Value Date  ? WBC 6.1 10/08/2021  ? NEUTROABS 3.5 10/08/2021  ? HGB 12.8 10/08/2021  ? HCT 3

## 2021-10-11 DIAGNOSIS — E785 Hyperlipidemia, unspecified: Secondary | ICD-10-CM | POA: Diagnosis not present

## 2021-12-05 DIAGNOSIS — Z86000 Personal history of in-situ neoplasm of breast: Secondary | ICD-10-CM | POA: Diagnosis not present

## 2021-12-05 IMAGING — MG DIGITAL DIAGNOSTIC BILAT W/ TOMO W/ CAD
9 series · 9 of 25 positions shown · non-contrast
Comparison: Previous exam(s).

CLINICAL DATA: History of a right lumpectomy for DCIS performed in
April 2020. Patient underwent adjuvant radiation therapy and had
bilateral breast reductions as part of her reconstruction at the
time of surgery. Routine annual post lumpectomy surveillance.

EXAM:
DIGITAL DIAGNOSTIC BILATERAL MAMMOGRAM WITH TOMOSYNTHESIS AND CAD
TECHNIQUE: Bilateral digital diagnostic mammography and breast tomosynthesis
was performed. The images were evaluated with computer-aided
detection.

[R CC]
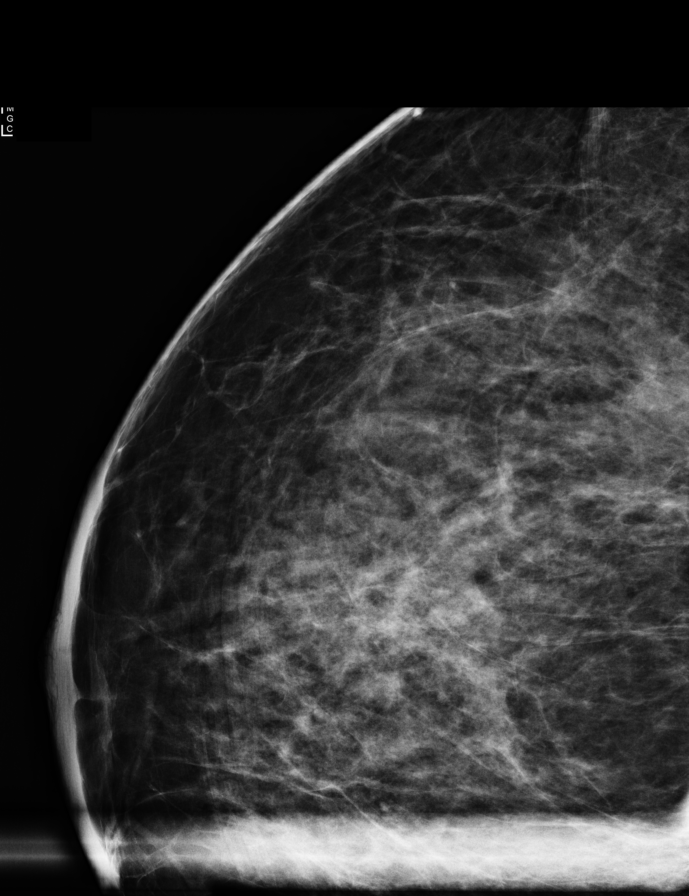

[L CC synth-2D]
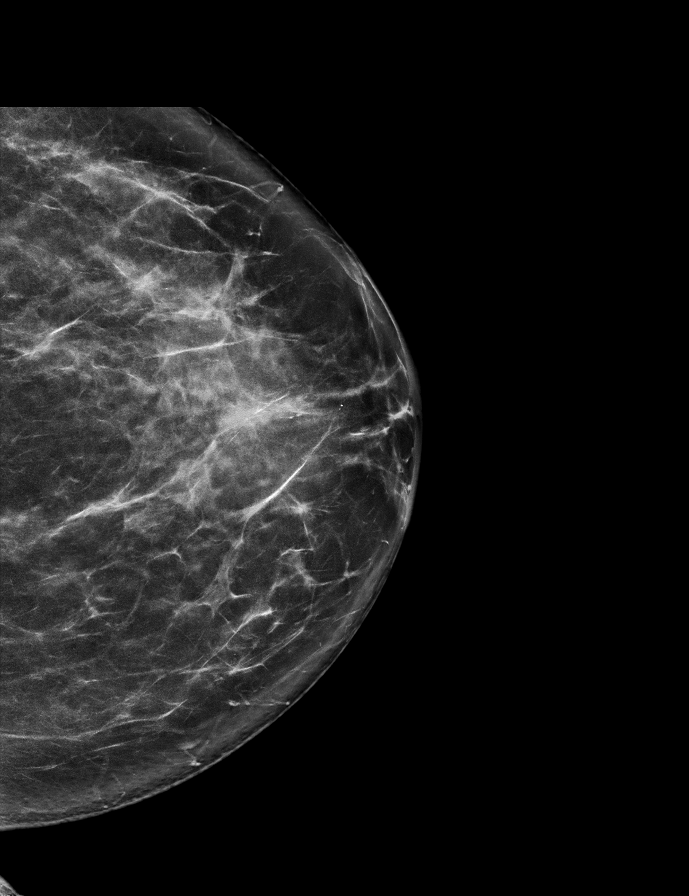

[R CC synth-2D]
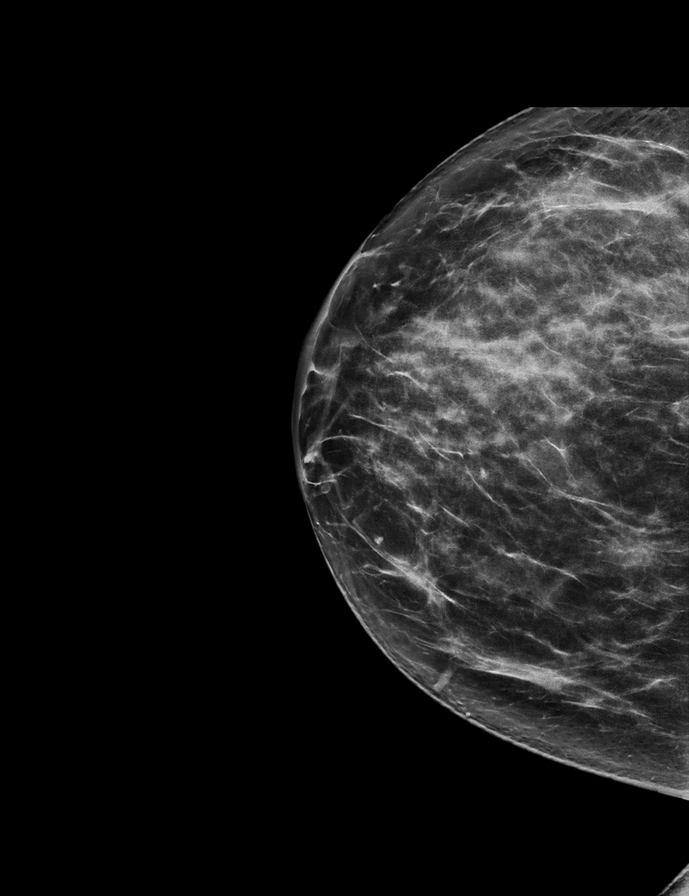

[L MLO synth-2D]
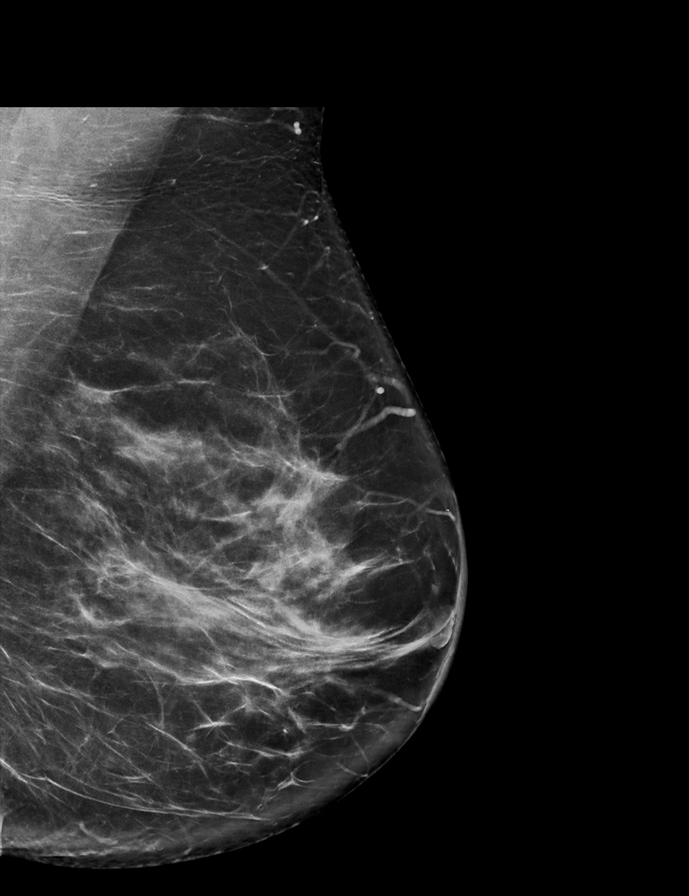

[R MLO synth-2D]
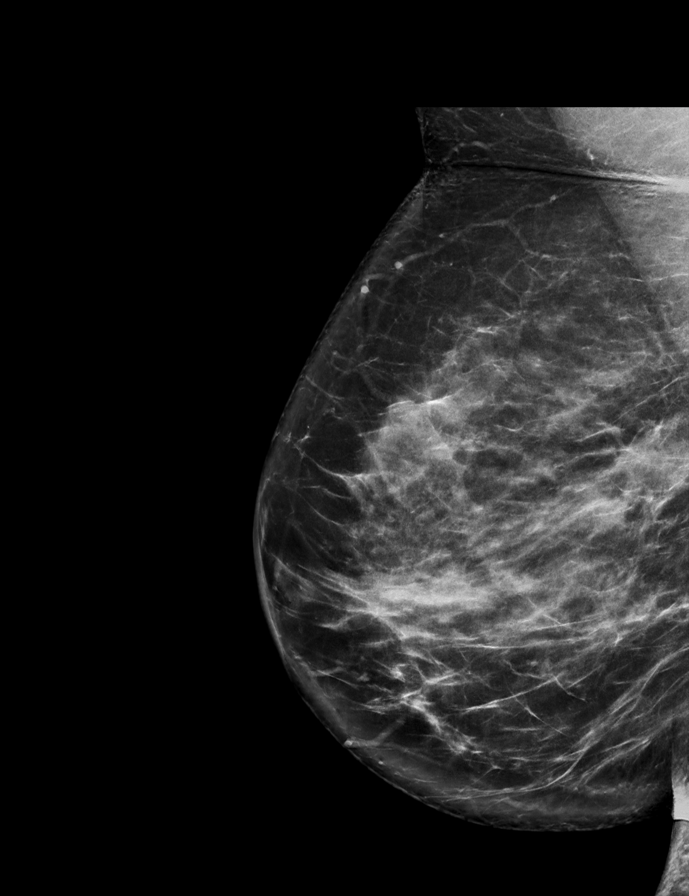

[R MLO tomo · tomo slice 40/79.0]
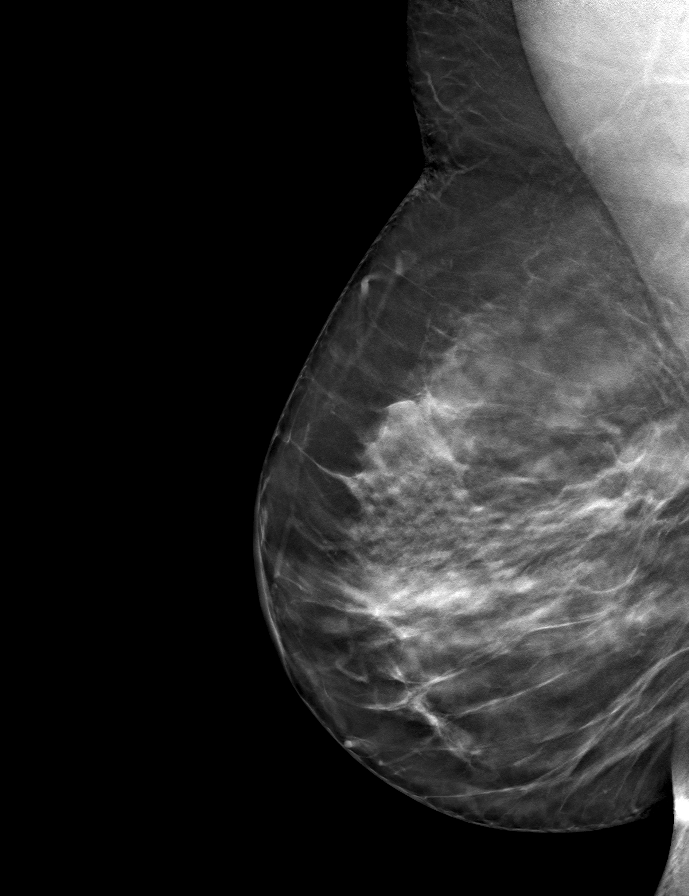

[L CC tomo · tomo slice 38/75.0]
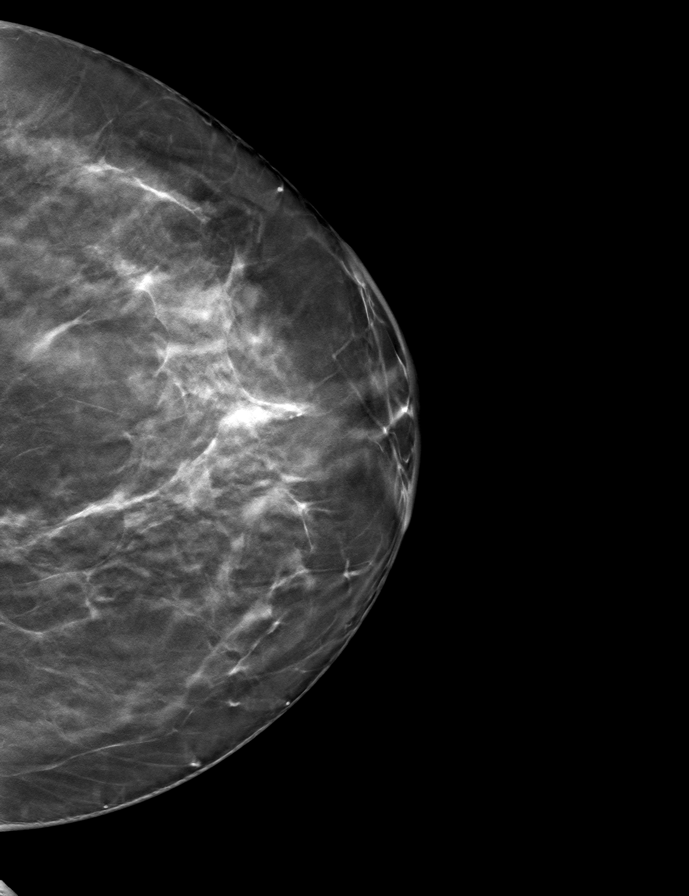

[R CC tomo · tomo slice 35/69.0]
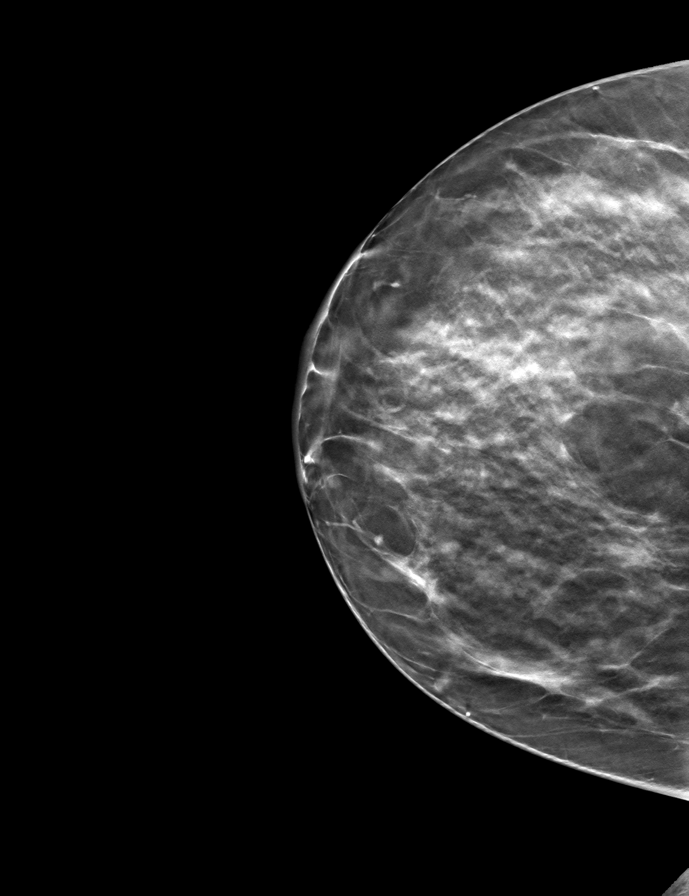

[L MLO tomo · tomo slice 43/84.0]
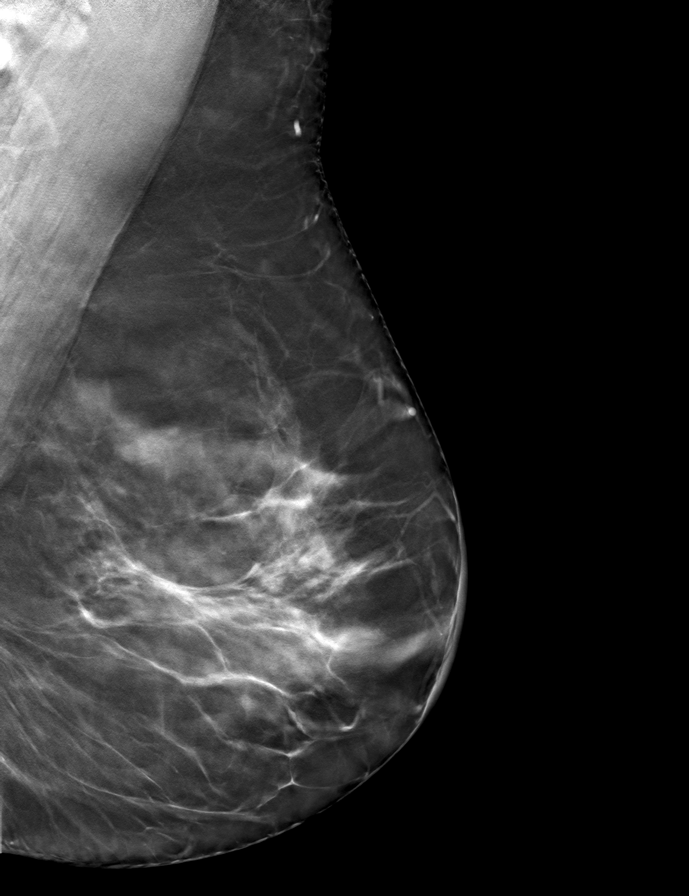

[9 of 25 positions shown; findings below may reference images not displayed]

ACR Breast Density Category c: The breast tissue is heterogeneously
dense, which may obscure small masses.
FINDINGS: Post lumpectomy changes are noted on the right.

There are no masses or areas of nonsurgical architectural
distortion. There are no significant areas of asymmetry and no
suspicious calcifications.
IMPRESSION: 1. No evidence of new or recurrent breast carcinoma.
2. Benign post lumpectomy changes on the right.

RECOMMENDATION:
Diagnostic mammography in 1 year per standard post lumpectomy
protocol.

I have discussed the findings and recommendations with the patient.
If applicable, a reminder letter will be sent to the patient
regarding the next appointment.

BI-RADS CATEGORY  2: Benign.

## 2021-12-12 ENCOUNTER — Other Ambulatory Visit: Payer: Self-pay | Admitting: Physician Assistant

## 2021-12-25 ENCOUNTER — Other Ambulatory Visit: Payer: Self-pay | Admitting: Hematology and Oncology

## 2021-12-25 DIAGNOSIS — N6310 Unspecified lump in the right breast, unspecified quadrant: Secondary | ICD-10-CM

## 2022-01-02 DIAGNOSIS — E669 Obesity, unspecified: Secondary | ICD-10-CM | POA: Diagnosis not present

## 2022-01-02 DIAGNOSIS — Z6833 Body mass index (BMI) 33.0-33.9, adult: Secondary | ICD-10-CM | POA: Diagnosis not present

## 2022-01-03 ENCOUNTER — Other Ambulatory Visit: Payer: BC Managed Care – PPO

## 2022-01-13 ENCOUNTER — Ambulatory Visit
Admission: RE | Admit: 2022-01-13 | Discharge: 2022-01-13 | Disposition: A | Discharge status: Home / Self Care | Payer: BC Managed Care – PPO | Source: Ambulatory Visit | Attending: Hematology and Oncology | Admitting: Hematology and Oncology

## 2022-01-13 DIAGNOSIS — R928 Other abnormal and inconclusive findings on diagnostic imaging of breast: Secondary | ICD-10-CM | POA: Diagnosis not present

## 2022-01-13 DIAGNOSIS — N6001 Solitary cyst of right breast: Secondary | ICD-10-CM | POA: Diagnosis not present

## 2022-01-13 DIAGNOSIS — N6310 Unspecified lump in the right breast, unspecified quadrant: Secondary | ICD-10-CM

## 2022-01-13 HISTORY — DX: Personal history of irradiation: Z92.3

## 2022-01-19 ENCOUNTER — Other Ambulatory Visit: Payer: Self-pay | Admitting: Hematology and Oncology

## 2022-01-20 ENCOUNTER — Telehealth: Payer: Self-pay | Admitting: *Deleted

## 2022-01-20 NOTE — Telephone Encounter (Signed)
This RN spoke with pt per refill request for tamoxifen. She states she has not held it and is continuing on it.   She notes hot flashes and weight gain but [facility].  She has been informed of the results of her recent mammo and noted oil cyst.  No other needs at this time.  Tamoxifen refill sent per above.

## 2022-02-10 ENCOUNTER — Ambulatory Visit
Admission: RE | Admit: 2022-02-10 | Discharge: 2022-02-10 | Disposition: A | Discharge status: Home / Self Care | Payer: BC Managed Care – PPO | Source: Ambulatory Visit | Attending: Hematology and Oncology | Admitting: Hematology and Oncology

## 2022-02-10 DIAGNOSIS — D0511 Intraductal carcinoma in situ of right breast: Secondary | ICD-10-CM

## 2022-02-10 DIAGNOSIS — Z853 Personal history of malignant neoplasm of breast: Secondary | ICD-10-CM | POA: Diagnosis not present

## 2022-02-10 DIAGNOSIS — R922 Inconclusive mammogram: Secondary | ICD-10-CM | POA: Diagnosis not present

## 2022-02-26 ENCOUNTER — Encounter: Payer: Self-pay | Admitting: Hematology and Oncology

## 2022-02-26 ENCOUNTER — Other Ambulatory Visit: Payer: Self-pay | Admitting: *Deleted

## 2022-02-26 DIAGNOSIS — M9903 Segmental and somatic dysfunction of lumbar region: Secondary | ICD-10-CM | POA: Diagnosis not present

## 2022-02-26 DIAGNOSIS — M9904 Segmental and somatic dysfunction of sacral region: Secondary | ICD-10-CM | POA: Diagnosis not present

## 2022-02-26 DIAGNOSIS — M5126 Other intervertebral disc displacement, lumbar region: Secondary | ICD-10-CM | POA: Diagnosis not present

## 2022-02-26 DIAGNOSIS — M9905 Segmental and somatic dysfunction of pelvic region: Secondary | ICD-10-CM | POA: Diagnosis not present

## 2022-02-26 MED ORDER — TAMOXIFEN CITRATE 20 MG PO TABS: 20.0000 mg | ORAL_TABLET | Freq: Every day | ORAL | 3 refills | Status: DC

## 2022-04-22 ENCOUNTER — Encounter: Payer: Self-pay | Admitting: Hematology and Oncology

## 2022-04-22 ENCOUNTER — Inpatient Hospital Stay: Payer: BC Managed Care – PPO | Attending: Hematology and Oncology | Admitting: Hematology and Oncology

## 2022-04-22 ENCOUNTER — Other Ambulatory Visit: Payer: Self-pay

## 2022-04-22 VITALS — BP 128/81 | HR 91 | Temp 97.9°F | Resp 18 | Ht 65.0 in | Wt 215.3 lb

## 2022-04-22 DIAGNOSIS — Z923 Personal history of irradiation: Secondary | ICD-10-CM | POA: Diagnosis not present

## 2022-04-22 DIAGNOSIS — Z87891 Personal history of nicotine dependence: Secondary | ICD-10-CM | POA: Diagnosis not present

## 2022-04-22 DIAGNOSIS — Z79810 Long term (current) use of selective estrogen receptor modulators (SERMs): Secondary | ICD-10-CM | POA: Insufficient documentation

## 2022-04-22 DIAGNOSIS — D0511 Intraductal carcinoma in situ of right breast: Secondary | ICD-10-CM | POA: Diagnosis not present

## 2022-04-22 NOTE — Progress Notes (Unsigned)
[facility]  Telephone:(336) [phone] Fax:(336) [phone]     ID: Catherine Santos DOB: 12-09-1970  MR#: [id number]  CHE#:[id number]  Patient Care Team: Willeen Niece, PA as PCP - General (Physician Assistant) Mauro Kaufmann, RN as Oncology Nurse Navigator Rockwell Germany, RN as Oncology Nurse Navigator Magrinat, Virgie Dad, MD (Inactive) as Consulting Physician (Oncology) Jovita Kussmaul, MD as Consulting Physician (General Surgery) Eppie Gibson, MD as Attending Physician (Radiation Oncology) Megan Salon, MD as Consulting Physician (Gynecology) Dillingham, Loel Lofty, DO as Attending Physician (Plastic Surgery) Benay Pike, MD  CHIEF COMPLAINT: noninvasive breast cancer, estrogen receptor positive  CURRENT TREATMENT: Tamoxifen  INTERVAL HISTORY:  Catherine Santos returns today for follow up of her noninvasive breast cancer. She has been tolerating tamoxifen well. She denies any other new complaints. Rest of the pertinent 10 point ROS reviewed and negative.  REVIEW OF SYSTEMS:   COVID 19 VACCINATION STATUS: s/p Pfizer x 3 as of October 2022   HISTORY OF CURRENT ILLNESS: From Dr. Geralyn Flash original intake note:   "Catherine Santos 51 y.o. female is here because of recent diagnosis of right breast ductal carcinoma in situ. Screening mammogram on 02/08/20 detected right breast calcifications. Diagnostic mammogram on 02/23/20 showed a 0.9cm group of right breast calcifications. 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 Marlou Starks. Pathology from the procedure [id number]) showed: ductal carcinoma in situ, high grade, with necrosis; margins negative.  The patient's subsequent history is as detailed below.   PAST MEDICAL HISTORY: Past Medical History:  Diagnosis Date   Abnormal Pap smear    Cancer ([place])    breast cancer   Dog bite 04/2017   Family history of breast cancer 03/23/2020    Family history of prostate cancer 03/23/2020   Personal history of radiation therapy     PAST SURGICAL HISTORY: Past Surgical History:  Procedure Laterality Date   BREAST BIOPSY Right 02/2020   BREAST LUMPECTOMY Right 04/2020   BREAST LUMPECTOMY WITH RADIOACTIVE SEED LOCALIZATION Right 04/27/2020   Procedure: RIGHT BREAST LUMPECTOMY WITH RADIOACTIVE SEED LOCALIZATION;  Surgeon: Jovita Kussmaul, MD;  Location: [place];  Service: General;  Laterality: Right;   BREAST REDUCTION SURGERY Bilateral 04/27/2020   Procedure: ONCOPLASTIC BREAST BILATERAL REDUCTION;  Surgeon: Wallace Going, DO;  Location: [facility];  Service: Plastics;  Laterality: Bilateral;   CYSTOSCOPY  12/30/2011   Procedure: CYSTOSCOPY;  Surgeon: Lyman Speller, MD;  Location: [facility];  Service: Gynecology;  Laterality: N/A;   REDUCTION MAMMAPLASTY Bilateral 04/2020   ROBOTIC ASSISTED LAP VAGINAL HYSTERECTOMY     SALPINGOOPHORECTOMY  12/30/2011   Procedure: SALPINGO OOPHERECTOMY;  Surgeon: Lyman Speller, MD;  Location: [facility];  Service: Gynecology;  Laterality: Left;   TUBAL LIGATION      FAMILY HISTORY: Family History  Problem Relation Age of Onset   Hypertension Mother    Diabetes Mother    Thyroid disease Mother    Breast cancer Cousin        maternal cousin-1st , diag 68s   Prostate cancer Maternal Uncle        dx unknown age   Lung cancer Paternal Grandfather        dx 24s; smoking hx   Cancer Maternal Uncle        unknown type; unknown age dx   Colon cancer Maternal Uncle  dx 45s   Cancer Cousin        maternal cousin; GYN cancer, unknown age dx  As of November 2021 the patient's mother is 26 years old with no cancer history.  A maternal uncle had prostate cancer and another colon cancer.  A third had an unknown type of cancer.  A maternal cousin had breast cancer in her 37s and a second maternal cousin had an unknown gynecologic cancer.  The patient's  father is 35 years old as of November 2021.  The paternal grandfather died from lung cancer at an advanced age.  There is no other family history of cancer to the patient's knowledge.   GYNECOLOGIC HISTORY:  Patient's last menstrual period was 11/17/2011. Menarche: 51 years old Age at first live birth: 51 years old [place] P 1 LMP status post hysterectomy Contraceptive remotely, without complications HRT N/A  Hysterectomy? yes BSO? 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  [name], 3 years old, is a Engineer, building services and his wife is in medical school in [place] where her son is planning to move soon.  The patient has no grandchildren.  She lives by herself with two boxers.  She is not a church attender    ADVANCED DIRECTIVES: Not in place.  She tells me she intends to name [name] as healthcare power of attorney.  At the 06/07/2020 visit she was given the appropriate documents to complete and notarize at her discretion.   HEALTH MAINTENANCE: Social History   Tobacco Use   Smoking status: Former    Types: Cigarettes   Smokeless tobacco: Never  Vaping Use   Vaping Use: Never used  Substance Use Topics   Alcohol use: Not Currently    Alcohol/week: 2.0 - 3.0 standard drinks of alcohol    Types: 2 - 3 Standard drinks or equivalent per week    Comment: on weekends   Drug use: No     Colonoscopy: Cologard 01/2020 negative (performed at [facility])  PAP:  (s/p hysterectomy)  Bone density: n/a (age)   Allergies  Allergen Reactions   Phentermine Palpitations   Penicillins Other (See Comments)    Pt doesn't remember reaction to PCN- was in childhood   Sulfa Antibiotics Rash    Current Outpatient Medications  Medication Sig Dispense Refill   Cholecalciferol (D3-1000) 25 MCG (1000 UT) tablet Take 1,000 Units by mouth daily. 2 tablets daily     Multiple Vitamin (MULTIVITAMIN PO) Take  by mouth.     SAXENDA 18 MG/3ML SOPN Inject 3 mg into the skin daily.     tamoxifen (NOLVADEX) 20 MG tablet Take 1 tablet (20 mg total) by mouth daily. 90 tablet 3   valACYclovir (VALTREX) 1000 MG tablet With symptom onset, take 2 tablets (two grams) and repeat in 12 hours. 30 tablet 4   No current facility-administered medications for this visit.    OBJECTIVE: White woman who appears well  Vitals:   04/22/22 1352  BP: 128/81  Pulse: 91  Resp: 18  Temp: 97.9 F (36.6 C)  SpO2: 96%     Body mass index is 35.83 kg/m.   Wt Readings from Last 3 Encounters:  04/22/22 215 lb 4.8 oz (97.7 kg)  10/08/21 214 lb 11.2 oz (97.4 kg)  04/22/21 208 lb 1.6 oz (94.4 kg)      ECOG FS:1 - Symptomatic but completely ambulatory  Physical  Exam Constitutional:      Appearance: Normal appearance.  Cardiovascular:     Rate and Rhythm: Normal rate and regular rhythm.  Pulmonary:     Effort: Pulmonary effort is normal.     Breath sounds: Normal breath sounds.  Chest:       Comments: Small oil cyst in the right breast in lower outer quadrant at the edge of the scar. Musculoskeletal:        General: Normal range of motion.     Cervical back: Normal range of motion and neck supple. No rigidity.  Lymphadenopathy:     Cervical: No cervical adenopathy.  Neurological:     Mental Status: She is alert.  Psychiatric:        Mood and Affect: Mood normal.       LAB RESULTS:  CMP     Component Value Date/Time   NA 138 10/08/2021 1521   NA 137 02/10/2019 0920   K 4.1 10/08/2021 1521   CL 107 10/08/2021 1521   CO2 24 10/08/2021 1521   GLUCOSE 99 10/08/2021 1521   BUN 12 10/08/2021 1521   BUN 13 02/10/2019 0920   CREATININE 0.76 10/08/2021 1521   CREATININE 0.83 08/05/2016 1422   CALCIUM 9.0 10/08/2021 1521   PROT 7.0 10/08/2021 1521   PROT 7.2 02/10/2019 0920   ALBUMIN 4.1 10/08/2021 1521   ALBUMIN 4.7 02/10/2019 0920   AST 15 10/08/2021 1521   ALT 16 10/08/2021 1521   ALKPHOS 72  10/08/2021 1521   BILITOT 0.2 (L) 10/08/2021 1521   GFRNONAA >60 10/08/2021 1521   GFRAA 117 02/10/2019 0920    No results found for: "TOTALPROTELP", "ALBUMINELP", "A1GS", "A2GS", "BETS", "BETA2SER", "GAMS", "MSPIKE", "SPEI"  Lab Results  Component Value Date   WBC 6.1 10/08/2021   NEUTROABS 3.5 10/08/2021   HGB 12.8 10/08/2021   HCT 34.7 (L) 10/08/2021   MCV 87.4 10/08/2021   PLT 292 10/08/2021    No results found for: "LABCA2"  No components found for: "QQIWLN989"  No results for input(s): "INR" in the last 168 hours.  No results found for: "LABCA2"  No results found for: "QJJ941"  No results found for: "CAN125"  No results found for: "CAN153"  No results found for: "CA2729"  No components found for: "HGQUANT"  No results found for: "CEA1", "CEA" / No results found for: "CEA1", "CEA"   No results found for: "AFPTUMOR"  No results found for: "CHROMOGRNA"  No results found for: "KPAFRELGTCHN", "LAMBDASER", "KAPLAMBRATIO" (kappa/lambda light chains)  No results found for: "HGBA", "HGBA2QUANT", "HGBFQUANT", "HGBSQUAN" (Hemoglobinopathy evaluation)   No results found for: "LDH"  No results found for: "IRON", "TIBC", "IRONPCTSAT" (Iron and TIBC)  No results found for: "FERRITIN"  Urinalysis    Component Value Date/Time   BILIRUBINUR N 11/03/2017 1505   PROTEINUR N 11/03/2017 1505   UROBILINOGEN 0.2 11/03/2017 1505   NITRITE N 11/03/2017 1505   LEUKOCYTESUR Small (1+) (A) 11/03/2017 1505    STUDIES: No results found.   ELIGIBLE FOR AVAILABLE RESEARCH PROTOCOL: AET  ASSESSMENT: 51 y.o. [place] woman status post right breast biopsy 03/07/2020 for ductal carcinoma in situ, intermediate to high-grade, estrogen and progesterone receptor positive.  (1) genetics testing 03/28/2020 through the [facility] Common Hereditary Cancers Panel found no deleterious mutations in APC, ATM, AXIN2, BARD1, BMPR1A, BRCA1, BRCA2, BRIP1, CDH1, CDK4, CDKN2A (p14ARF), CDKN2A  (p16INK4a), CHEK2, CTNNA1, DICER1, EPCAM (Deletion/duplication testing only), GREM1 (promoter region deletion/duplication testing only), KIT, MEN1, MLH1, MSH2, MSH3, MSH6, MUTYH, NBN, NF1,  NHTL1, PALB2, PDGFRA, PMS2, POLD1, POLE, PTEN, RAD50, RAD51C, RAD51D, RNF43, SDHB, SDHC, SDHD, SMAD4, SMARCA4. STK11, TP53, TSC1, TSC2, and VHL.  The following genes were evaluated for sequence changes only: SDHA and HOXB13 c.251G>A variant only.  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  (2) status post right lumpectomy 04/27/2020 for ductal carcinoma in situ, high-grade, with negative margins.  (a) status post bilateral breast reduction 04/27/2020  (3) adjuvant radiation 06/05/2020 through 07/05/2020 Site Technique Total Dose (Gy) Dose per Fx (Gy) Completed Fx Beam Energies  Breast, Right: Breast_Rt 3D 40.05/40.05 2.67 15/15 10X  Breast, Right: Breast_Rt_Bst 3D 10/10 2 5/5 6X, 10X   (4) started tamoxifen 07/24/2020   PLAN:  Catherine Santos is here for follow-up on adjuvant tamoxifen.  She has been tolerating the tamoxifen very well except for a recent episode of hypertriglyceridemia.   Total time spent: 30 minutes *Total Encounter Time as defined by the Centers for Medicare and Medicaid Services includes, in addition to the face-to-face time of a patient visit (documented in the note above) non-face-to-face time: obtaining and reviewing outside history, ordering and reviewing medications, tests or procedures, care coordination (communications with other health care professionals or caregivers) and documentation in the medical record.

## 2022-04-28 DIAGNOSIS — M9903 Segmental and somatic dysfunction of lumbar region: Secondary | ICD-10-CM | POA: Diagnosis not present

## 2022-04-28 DIAGNOSIS — M9904 Segmental and somatic dysfunction of sacral region: Secondary | ICD-10-CM | POA: Diagnosis not present

## 2022-04-28 DIAGNOSIS — M9905 Segmental and somatic dysfunction of pelvic region: Secondary | ICD-10-CM | POA: Diagnosis not present

## 2022-04-28 DIAGNOSIS — M5126 Other intervertebral disc displacement, lumbar region: Secondary | ICD-10-CM | POA: Diagnosis not present

## 2022-05-26 DIAGNOSIS — M5126 Other intervertebral disc displacement, lumbar region: Secondary | ICD-10-CM | POA: Diagnosis not present

## 2022-05-26 DIAGNOSIS — M9904 Segmental and somatic dysfunction of sacral region: Secondary | ICD-10-CM | POA: Diagnosis not present

## 2022-05-26 DIAGNOSIS — M9903 Segmental and somatic dysfunction of lumbar region: Secondary | ICD-10-CM | POA: Diagnosis not present

## 2022-05-26 DIAGNOSIS — M9905 Segmental and somatic dysfunction of pelvic region: Secondary | ICD-10-CM | POA: Diagnosis not present

## 2022-06-24 DIAGNOSIS — M9903 Segmental and somatic dysfunction of lumbar region: Secondary | ICD-10-CM | POA: Diagnosis not present

## 2022-06-24 DIAGNOSIS — M5126 Other intervertebral disc displacement, lumbar region: Secondary | ICD-10-CM | POA: Diagnosis not present

## 2022-06-24 DIAGNOSIS — M9905 Segmental and somatic dysfunction of pelvic region: Secondary | ICD-10-CM | POA: Diagnosis not present

## 2022-06-24 DIAGNOSIS — M9904 Segmental and somatic dysfunction of sacral region: Secondary | ICD-10-CM | POA: Diagnosis not present

## 2022-06-25 DIAGNOSIS — D2261 Melanocytic nevi of right upper limb, including shoulder: Secondary | ICD-10-CM | POA: Diagnosis not present

## 2022-06-25 DIAGNOSIS — L738 Other specified follicular disorders: Secondary | ICD-10-CM | POA: Diagnosis not present

## 2022-06-25 DIAGNOSIS — L918 Other hypertrophic disorders of the skin: Secondary | ICD-10-CM | POA: Diagnosis not present

## 2022-06-25 DIAGNOSIS — L820 Inflamed seborrheic keratosis: Secondary | ICD-10-CM | POA: Diagnosis not present

## 2022-06-25 DIAGNOSIS — L821 Other seborrheic keratosis: Secondary | ICD-10-CM | POA: Diagnosis not present

## 2022-07-29 DIAGNOSIS — M9903 Segmental and somatic dysfunction of lumbar region: Secondary | ICD-10-CM | POA: Diagnosis not present

## 2022-07-29 DIAGNOSIS — M9905 Segmental and somatic dysfunction of pelvic region: Secondary | ICD-10-CM | POA: Diagnosis not present

## 2022-07-29 DIAGNOSIS — M9904 Segmental and somatic dysfunction of sacral region: Secondary | ICD-10-CM | POA: Diagnosis not present

## 2022-07-29 DIAGNOSIS — M5126 Other intervertebral disc displacement, lumbar region: Secondary | ICD-10-CM | POA: Diagnosis not present

## 2022-08-02 DIAGNOSIS — J069 Acute upper respiratory infection, unspecified: Secondary | ICD-10-CM | POA: Diagnosis not present

## 2022-08-02 DIAGNOSIS — R062 Wheezing: Secondary | ICD-10-CM | POA: Diagnosis not present

## 2022-08-02 DIAGNOSIS — R051 Acute cough: Secondary | ICD-10-CM | POA: Diagnosis not present

## 2022-08-22 ENCOUNTER — Encounter (HOSPITAL_BASED_OUTPATIENT_CLINIC_OR_DEPARTMENT_OTHER): Payer: Self-pay | Admitting: Obstetrics & Gynecology

## 2022-08-22 ENCOUNTER — Ambulatory Visit (INDEPENDENT_AMBULATORY_CARE_PROVIDER_SITE_OTHER): Payer: BC Managed Care – PPO | Admitting: Obstetrics & Gynecology

## 2022-08-22 VITALS — BP 120/78 | HR 90 | Ht 65.0 in | Wt 207.2 lb

## 2022-08-22 DIAGNOSIS — Z79899 Other long term (current) drug therapy: Secondary | ICD-10-CM | POA: Diagnosis not present

## 2022-08-22 DIAGNOSIS — Z6834 Body mass index (BMI) 34.0-34.9, adult: Secondary | ICD-10-CM

## 2022-08-22 DIAGNOSIS — Z01419 Encounter for gynecological examination (general) (routine) without abnormal findings: Secondary | ICD-10-CM

## 2022-08-22 DIAGNOSIS — Z8619 Personal history of other infectious and parasitic diseases: Secondary | ICD-10-CM

## 2022-08-22 DIAGNOSIS — D0511 Intraductal carcinoma in situ of right breast: Secondary | ICD-10-CM

## 2022-08-22 NOTE — Progress Notes (Signed)
52 y.o. G74P0011 Single White or Caucasian female here for annual exam.  Doing well.  Had recent bronchitis.  Needs new PCP.  Options discussed.    Daughter in law will match in March.  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  Denies vaginal bleeding.    Not dating.    Patient's last menstrual period was 11/17/2011.          Sexually active: No.  The current method of family planning is status post hysterectomy.    Smoker:  no  Health Maintenance: Pap:  not indicated History of abnormal Pap:  remote hx MMG:  02/10/2022 No evidence of new or recurrent breast carcinoma Colonoscopy:  02/19/2020 Screening Labs: lab work ordered   reports that she has quit smoking. Her smoking use included cigarettes. She has never used smokeless tobacco. She reports that she does not currently use alcohol after a past usage of about 2.0 - 3.0 standard drinks of alcohol per week. She reports that she does not use drugs.  Past Medical History:  Diagnosis Date   Abnormal Pap smear    Cancer ([place])    breast cancer   Dog bite 04/2017   Family history of breast cancer 03/23/2020   Family history of prostate cancer 03/23/2020   Personal history of radiation therapy     Past Surgical History:  Procedure Laterality Date   BREAST BIOPSY Right 02/2020   BREAST LUMPECTOMY Right 04/2020   BREAST LUMPECTOMY WITH RADIOACTIVE SEED LOCALIZATION Right 04/27/2020   Procedure: RIGHT BREAST LUMPECTOMY WITH RADIOACTIVE SEED LOCALIZATION;  Surgeon: Jovita Kussmaul, MD;  Location: [facility];  Service: General;  Laterality: Right;   BREAST REDUCTION SURGERY Bilateral 04/27/2020   Procedure: ONCOPLASTIC BREAST BILATERAL REDUCTION;  Surgeon: Wallace Going, DO;  Location: [facility];  Service: Plastics;  Laterality: Bilateral;   CYSTOSCOPY  12/30/2011   Procedure: CYSTOSCOPY;  Surgeon: Lyman Speller, MD;  Location: [facility];  Service: Gynecology;  Laterality: N/A;   REDUCTION MAMMAPLASTY  Bilateral 04/2020   ROBOTIC ASSISTED LAP VAGINAL HYSTERECTOMY     SALPINGOOPHORECTOMY  12/30/2011   Procedure: SALPINGO OOPHERECTOMY;  Surgeon: Lyman Speller, MD;  Location: [facility];  Service: Gynecology;  Laterality: Left;   TUBAL LIGATION      Current Outpatient Medications  Medication Sig Dispense Refill   Cholecalciferol (D3-1000) 25 MCG (1000 UT) tablet Take 1,000 Units by mouth daily. 2 tablets daily     Multiple Vitamin (MULTIVITAMIN PO) Take by mouth.     tamoxifen (NOLVADEX) 20 MG tablet Take 1 tablet (20 mg total) by mouth daily. 90 tablet 3   valACYclovir (VALTREX) 1000 MG tablet With symptom onset, take 2 tablets (two grams) and repeat in 12 hours. 30 tablet 4   No current facility-administered medications for this visit.    Family History  Problem Relation Age of Onset   Hypertension Mother    Diabetes Mother    Thyroid disease Mother    Breast cancer Cousin        maternal cousin-1st , diag 47s   Prostate cancer Maternal Uncle        dx unknown age   Lung cancer Paternal Grandfather        dx 12s; smoking hx   Cancer Maternal Uncle        unknown type; unknown age dx   Colon cancer Maternal Uncle        dx 55s   Cancer Cousin  maternal cousin; GYN cancer, unknown age dx    ROS: Constitutional: negative Genitourinary:negative  Exam:   BP 120/78   Pulse 90   Ht '5\' 5"'$  (1.651 m) Comment: reported  Wt 207 lb 3.2 oz (94 kg)   LMP 11/17/2011   BMI 34.48 kg/m   Height: '5\' 5"'$  (165.1 cm) (reported)  General appearance: alert, cooperative and appears stated age Head: Normocephalic, without obvious abnormality, atraumatic Neck: no adenopathy, supple, symmetrical, trachea midline and thyroid normal to inspection and palpation Lungs: clear to auscultation bilaterally Breasts: normal appearance, no masses or tenderness Heart: regular rate and rhythm Abdomen: soft, non-tender; bowel sounds normal; no masses,  no organomegaly Extremities: extremities  normal, atraumatic, no cyanosis or edema Skin: Skin color, texture, turgor normal. No rashes or lesions Lymph nodes: Cervical, supraclavicular, and axillary nodes normal. No abnormal inguinal nodes palpated Neurologic: Grossly normal   Pelvic: External genitalia:  no lesions              Urethra:  normal appearing urethra with no masses, tenderness or lesions              Bartholins and Skenes: normal                 Vagina: normal appearing vagina with normal color and no discharge, no lesions              Cervix: absent              Pap taken: No. Bimanual Exam:  Uterus:  uterus absent              Adnexa: no mass, fullness, tenderness               Rectovaginal: Confirms               Anus:  normal sphincter tone, no lesions  Chaperone, Octaviano Batty, CMA, was present for exam.  Assessment/Plan: 1. Well woman exam with routine gynecological exam - Pap smear not indicated - Mammogram 02/10/2022 - Colonoscopy declined.  Cologuard 01/2020.  I will order later this summer. - lab work ordered - vaccines reviewed/updated  2. High risk medication use - Comprehensive metabolic panel - CBC with Differential/Platelet - Hemoglobin A1c - Lipid panel - TSH  3. BMI 34.0-34.9,adult  4. History of cold sores - doesn't need RF  5. 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 Chryl Heck

## 2022-08-23 LAB — LIPID PANEL
Chol/HDL Ratio: 8.7 ratio — ABNORMAL HIGH (ref 0.0–4.4)
Cholesterol, Total: 200 mg/dL — ABNORMAL HIGH (ref 100–199)
HDL: 23 mg/dL — ABNORMAL LOW (ref >39)
LDL Chol Calc (NIH): 90 mg/dL (ref 0–99)
Triglycerides: 530 mg/dL — ABNORMAL HIGH (ref 0–149)
VLDL Cholesterol Cal: 87 mg/dL — ABNORMAL HIGH (ref 5–40)

## 2022-08-23 LAB — COMPREHENSIVE METABOLIC PANEL
ALT: 20 IU/L (ref 0–32)
AST: 20 IU/L (ref 0–40)
Albumin/Globulin Ratio: 1.6 (ref 1.2–2.2)
Albumin: 4.7 g/dL (ref 3.9–4.9)
Alkaline Phosphatase: 82 IU/L (ref 44–121)
BUN/Creatinine Ratio: 22 (ref 9–23)
BUN: 17 mg/dL (ref 6–24)
Bilirubin Total: 0.3 mg/dL (ref 0.0–1.2)
CO2: 16 mmol/L — ABNORMAL LOW (ref 20–29)
Calcium: 9.7 mg/dL (ref 8.7–10.2)
Chloride: 109 mmol/L — ABNORMAL HIGH (ref 96–106)
Creatinine, Ser: 0.76 mg/dL (ref 0.57–1.00)
Globulin, Total: 2.9 g/dL (ref 1.5–4.5)
Glucose: 111 mg/dL — ABNORMAL HIGH (ref 70–99)
Potassium: 4.8 mmol/L (ref 3.5–5.2)
Sodium: 147 mmol/L — ABNORMAL HIGH (ref 134–144)
Total Protein: 7.6 g/dL (ref 6.0–8.5)
eGFR: 95 mL/min/{1.73_m2} (ref >59)

## 2022-08-23 LAB — CBC WITH DIFFERENTIAL/PLATELET
Basophils Absolute: 0.1 10*3/uL (ref 0.0–0.2)
Basos: 1 %
EOS (ABSOLUTE): 0.2 10*3/uL (ref 0.0–0.4)
Eos: 3 %
Hematocrit: 40.3 % (ref 34.0–46.6)
Hemoglobin: 13.9 g/dL (ref 11.1–15.9)
Immature Grans (Abs): 0 10*3/uL (ref 0.0–0.1)
Immature Granulocytes: 0 %
Lymphocytes Absolute: 2.4 10*3/uL (ref 0.7–3.1)
Lymphs: 47 %
MCH: 31 pg (ref 26.6–33.0)
MCHC: 34.5 g/dL (ref 31.5–35.7)
MCV: 90 fL (ref 79–97)
Monocytes Absolute: 0.3 10*3/uL (ref 0.1–0.9)
Monocytes: 6 %
Neutrophils Absolute: 2.2 10*3/uL (ref 1.4–7.0)
Neutrophils: 43 %
Platelets: 325 10*3/uL (ref 150–450)
RBC: 4.48 x10E6/uL (ref 3.77–5.28)
RDW: 12.5 % (ref 11.7–15.4)
WBC: 5.2 10*3/uL (ref 3.4–10.8)

## 2022-08-23 LAB — TSH: TSH: 2.24 u[IU]/mL (ref 0.450–4.500)

## 2022-08-23 LAB — HEMOGLOBIN A1C
Est. average glucose Bld gHb Est-mCnc: 114 mg/dL
Hgb A1c MFr Bld: 5.6 % (ref 4.8–5.6)

## 2022-09-01 ENCOUNTER — Encounter (HOSPITAL_BASED_OUTPATIENT_CLINIC_OR_DEPARTMENT_OTHER): Payer: Self-pay | Admitting: Obstetrics & Gynecology

## 2022-09-02 ENCOUNTER — Encounter (HOSPITAL_BASED_OUTPATIENT_CLINIC_OR_DEPARTMENT_OTHER): Payer: Self-pay | Admitting: Obstetrics & Gynecology

## 2022-09-02 ENCOUNTER — Ambulatory Visit (HOSPITAL_BASED_OUTPATIENT_CLINIC_OR_DEPARTMENT_OTHER): Payer: BC Managed Care – PPO | Admitting: Obstetrics & Gynecology

## 2022-09-02 VITALS — BP 138/92 | HR 100 | Ht 65.0 in | Wt 214.0 lb

## 2022-09-02 DIAGNOSIS — D0511 Intraductal carcinoma in situ of right breast: Secondary | ICD-10-CM

## 2022-09-02 DIAGNOSIS — N952 Postmenopausal atrophic vaginitis: Secondary | ICD-10-CM | POA: Diagnosis not present

## 2022-09-07 DIAGNOSIS — N952 Postmenopausal atrophic vaginitis: Secondary | ICD-10-CM | POA: Insufficient documentation

## 2022-09-07 NOTE — Progress Notes (Signed)
GYNECOLOGY  VISIT  CC:   vaginal spotting  HPI: 52 y.o. G97P0011 Single White or Caucasian female here for complaint of vaginal spotting after recent intercourse.  Had not been SA for a while and so bleeding was surprising.  Denies pain or odor.   Past Medical History:  Diagnosis Date   Abnormal Pap smear    Cancer ([place])    breast cancer   Dog bite 04/2017   Family history of breast cancer 03/23/2020   Family history of prostate cancer 03/23/2020   Personal history of radiation therapy     MEDS:   Current Outpatient Medications on File Prior to Visit  Medication Sig Dispense Refill   Cholecalciferol (D3-1000) 25 MCG (1000 UT) tablet Take 1,000 Units by mouth daily. 2 tablets daily     Multiple Vitamin (MULTIVITAMIN PO) Take by mouth.     tamoxifen (NOLVADEX) 20 MG tablet Take 1 tablet (20 mg total) by mouth daily. 90 tablet 3   valACYclovir (VALTREX) 1000 MG tablet With symptom onset, take 2 tablets (two grams) and repeat in 12 hours. 30 tablet 4   No current facility-administered medications on file prior to visit.    ALLERGIES: Phentermine, Penicillins, and Sulfa antibiotics  SH:  single, non smoker  Review of Systems  Constitutional: Negative.   Genitourinary:        Vaginal spotting    PHYSICAL EXAMINATION:    BP (!) 138/92   Pulse 100   Ht 5\' 5"  (1.651 m) Comment: Reported  Wt 214 lb (97.1 kg)   LMP 11/17/2011   BMI 35.61 kg/m     General appearance: alert, cooperative and appears stated age Lymph:  no inguinal LAD noted  Pelvic: External genitalia:  no lesions              Urethra:  normal appearing urethra with no masses, tenderness or lesions              Bartholins and Skenes: normal                 Vagina: atrophic appearing tissues with erythema and some spotting with manipulation of speculum, cuff well healed              Cervix: absent              Bimanual Exam:  Uterus:  uterus absent              Adnexa: no mass, fullness, tenderness                Chaperone, Octaviano Batty, CMA, was present for exam.  Assessment/Plan: 1. Vaginal atrophy - pt has hx of breast cancer so estrogen products not recommended.  OTC products discussed.  Could also use vit E vaginal suppositories which can be compounded.  She is going to try one of the options we discussed first.  Will let me know if I need to send in rx for her.  2. Breast neoplasm, Tis (DCIS), right

## 2022-09-09 ENCOUNTER — Encounter: Payer: Self-pay | Admitting: Family Medicine

## 2022-09-09 ENCOUNTER — Ambulatory Visit: Payer: BC Managed Care – PPO | Admitting: Family Medicine

## 2022-09-09 VITALS — BP 112/82 | HR 88 | Temp 98.3°F | Ht 65.0 in | Wt 206.1 lb

## 2022-09-09 DIAGNOSIS — E782 Mixed hyperlipidemia: Secondary | ICD-10-CM | POA: Diagnosis not present

## 2022-09-09 DIAGNOSIS — E6609 Other obesity due to excess calories: Secondary | ICD-10-CM | POA: Diagnosis not present

## 2022-09-09 DIAGNOSIS — R030 Elevated blood-pressure reading, without diagnosis of hypertension: Secondary | ICD-10-CM

## 2022-09-09 DIAGNOSIS — Z6834 Body mass index (BMI) 34.0-34.9, adult: Secondary | ICD-10-CM | POA: Diagnosis not present

## 2022-09-09 MED ORDER — ICOSAPENT ETHYL 1 G PO CAPS: 2.0000 g | ORAL_CAPSULE | Freq: Two times a day (BID) | ORAL | 1 refills | Status: DC

## 2022-09-09 MED ORDER — ZEPBOUND 2.5 MG/0.5ML ~~LOC~~ SOAJ: 2.5000 mg | SUBCUTANEOUS | 0 refills | Status: DC

## 2022-09-09 NOTE — Patient Instructions (Signed)
Welcome to [facility]! It was a pleasure meeting you today.  As discussed, Please schedule a 3 month follow up visit today.  Nordic naturals-fish oil  Colace 100mg  daily, miralax, water, massage tummy, tea for constipation  PLEASE NOTE:  If you had any LAB tests please let us know if you have not heard back within a few days. You may see your results on MyChart before we have a chance to review them but we will give you a call once they are reviewed by [place]. If we ordered any REFERRALS today, please let us know if you have not heard from their office within the next week.  Let us know through MyChart if you are needing REFILLS, or have your pharmacy send [place] the request. You can also use MyChart to communicate with me or any office staff.  Please try these tips to maintain a healthy lifestyle:  Eat most of your calories during the day when you are active. Eliminate processed foods including packaged sweets (pies, cakes, cookies), reduce intake of potatoes, white bread, white pasta, and white rice. Look for whole grain options, oat flour or almond flour.  Each meal should contain half fruits/vegetables, one quarter protein, and one quarter carbs (no bigger than a computer mouse).  Cut down on sweet beverages. This includes juice, soda, and sweet tea. Also watch fruit intake, though this is a healthier sweet option, it still contains natural sugar! Limit to 3 servings daily.  Drink at least 1 glass of water with each meal and aim for at least 8 glasses per day  Exercise at least 150 minutes every week.

## 2022-09-09 NOTE — Progress Notes (Signed)
New Patient Office Visit  Subjective:  Patient ID: Catherine Santos, female    DOB: 11-22-1970  Age: 52 y.o. MRN: [id number]  CC:  Chief Complaint  Patient presents with   Establish Care    Initial visit to establish care with new pcp  Elevated Triglycerides, Cholesterol and Blood pressure Fasting       HPI Catherine Santos presents for new pt-HLD/bp  HLD-trigs and total-fasting labs done by gyn in March.  Trigs 530.  Trigs 445 last yr at pcp-but didn't seem concerned so pt wasn't.  Now, concerned.  S Has been working on lower carb/keto for 2 mo.  Just started walking again.   Obesity-Saxenda-bad ha and not feel good at all.  Couldn't get wegovy d/t shortage.  Would like to try something again.  No FH MTC.Catherine Santos  Bp's-130's/91. Onc in Oct-ok.  Gyn 08/22/22.asymptomatic.  started walking again-49min/day. Past 2 yrs, gained 20-30 #.  Mom started w/bp in 50's as well.  Pt very worried   Past Medical History:  Diagnosis Date   Abnormal Pap smear    Cancer ([place])    breast cancer   Dog bite 04/2017   Family history of breast cancer 03/23/2020   Family history of prostate cancer 03/23/2020   Personal history of radiation therapy     Past Surgical History:  Procedure Laterality Date   ABDOMINAL HYSTERECTOMY     endometriosis   USO   BREAST BIOPSY Right 02/2020   BREAST LUMPECTOMY Right 04/2020   BREAST LUMPECTOMY WITH RADIOACTIVE SEED LOCALIZATION Right 04/27/2020   Procedure: RIGHT BREAST LUMPECTOMY WITH RADIOACTIVE SEED LOCALIZATION;  Surgeon: Jovita Kussmaul, MD;  Location: [facility];  Service: General;  Laterality: Right;   BREAST REDUCTION SURGERY Bilateral 04/27/2020   Procedure: ONCOPLASTIC BREAST BILATERAL REDUCTION;  Surgeon: Wallace Going, DO;  Location: [facility];  Service: Plastics;  Laterality: Bilateral;   CYSTOSCOPY  12/30/2011   Procedure: CYSTOSCOPY;  Surgeon: Lyman Speller, MD;  Location: [facility];  Service: Gynecology;   Laterality: N/A;   REDUCTION MAMMAPLASTY Left 04/2020   ROBOTIC ASSISTED LAP VAGINAL HYSTERECTOMY     SALPINGOOPHORECTOMY  12/30/2011   Procedure: SALPINGO OOPHERECTOMY;  Surgeon: Lyman Speller, MD;  Location: [facility];  Service: Gynecology;  Laterality: Left;   TUBAL LIGATION      Family History  Problem Relation Age of Onset   Hypertension Mother    Diabetes Mother    Thyroid disease Mother    Arthritis Mother    Prostate cancer Maternal Uncle        dx unknown age   75 Maternal Uncle        unknown type; unknown age dx   Colon cancer Maternal Uncle        dx 73s   Lung cancer Paternal Grandfather        dx 10s; smoking hx   Breast cancer Cousin        maternal cousin-1st , diag 38s   Cancer Cousin        maternal cousin; GYN cancer, unknown age dx    Social History   Socioeconomic History   Marital status: Single    Spouse name: Not on file   Number of children: 1   Years of education: Not on file   Highest education level: Not on file  Occupational History   Occupation: quality mgr    Employer: [employer]  Tobacco Use   Smoking status: Former  Types: Cigarettes    Quit date: 2000    Years since quitting: 24.2   Smokeless tobacco: Never  Vaping Use   Vaping Use: Never used  Substance and Sexual Activity   Alcohol use: Not Currently    Alcohol/week: 2.0 - 3.0 standard drinks of alcohol    Types: 2 - 3 Standard drinks or equivalent per week    Comment: on weekends   Drug use: No   Sexual activity: Yes    Partners: Male    Birth control/protection: Surgical    Comment: BTL, then TLH  Other Topics Concern   Not on file  Social History Narrative   Not on file   Social Determinants of Health   Financial Resource Strain: Not on file  Food Insecurity: Not on file  Transportation Needs: Not on file  Physical Activity: Not on file  Stress: Not on file  Social Connections: Not on file  Intimate Partner Violence: Not on file    ROS  ROS: Gen: no  fever, chills  Skin: no rash, itching ENT: no ear pain, ear drainage, nasal congestion, rhinorrhea, sinus pressure, sore throat Eyes: no blurry vision, double vision Resp: no cough, wheeze,SOB CV: no CP, palpitations, LE edema,  GI: no heartburn, n/v/d/c, abd pain GU: no dysuria, urgency, frequency, hematuria MSK: no joint pain, myalgias, back pain Neuro: no dizziness,  weakness, vertigo Psych: no depression, anxiety, insomnia, SI   Objective:   Today's Vitals: BP 112/82 (BP Location: Right Arm, Patient Position: Sitting, Cuff Size: Large)   Pulse 88   Temp 98.3 F (36.8 C) (Temporal)   Ht 5\' 5"  (1.651 m)   Wt 206 lb 2 oz (93.5 kg)   LMP 11/17/2011   SpO2 98%   BMI 34.30 kg/m   Physical Exam  Gen: WDWN NAD [facility] HEENT: NCAT, conjunctiva not injected, sclera nonicteric NECK:  supple, no thyromegaly, no nodes, no carotid bruits CARDIAC: RRR, S1S2+, no murmur. DP 2+B LUNGS: CTAB. No wheezes ABDOMEN:  BS+, soft, NTND, No HSM, no masses EXT:  no edema MSK: no gross abnormalities.  NEURO: A&O x3.  CN II-XII intact.  PSYCH: normal mood. Good eye contact   Reviewed labs and notes from prev pcp from [place]:   Problem List Items Addressed This Visit       Other   Hyperlipidemia, mixed - Primary   Relevant Medications   icosapent Ethyl (VASCEPA) 1 g capsule   Other Visit Diagnoses     Elevated blood pressure reading       Class 1 obesity due to excess calories with serious comorbidity and body mass index (BMI) of 34.0 to 34.9 in adult       Relevant Medications   tirzepatide (ZEPBOUND) 2.5 MG/0.5ML Pen     1.  Mixed hyperlipidemia-triglycerides elevated and HDL low.  LDL 90.  Discussed diet.  Exercise.  Will start Vascepa 2 g twice daily.  Follow-up in 3 months.  Advised this pattern is more high risk. 2.  Elevated blood pressure-on my recheck of each arm, leg pressures are good.  However, need to get more data.  Patient will get a large cuff and start  monitoring blood pressures.  Discussed she is not overly high at this stage.  We can start trial of aggressive lifestyle changes.  If blood pressures are not consistently greater than 160s over 95, then would continue lifestyle changes.  Follow-up in 65-month 3.  Obesity with other comorbidities-continue diet/exercise.  Will start  stepdown to 2.5 mg weekly-side effects discussed.  Patient was intolerant of Saxenda-discussed that this could happen with the other GLP-1's as well.  She would still like to give it a try.  Call monthly for increases or continuing the same dose.  Follow-up in 3 months  Outpatient Encounter Medications as of 09/09/2022  Medication Sig   Cholecalciferol (D3-1000) 25 MCG (1000 UT) tablet Take 1,000 Units by mouth daily. 2 tablets daily   icosapent Ethyl (VASCEPA) 1 g capsule Take 2 capsules (2 g total) by mouth 2 (two) times daily.   Multiple Vitamin (MULTIVITAMIN PO) Take by mouth.   tamoxifen (NOLVADEX) 20 MG tablet Take 1 tablet (20 mg total) by mouth daily.   tirzepatide (ZEPBOUND) 2.5 MG/0.5ML Pen Inject 2.5 mg into the skin once a week.   valACYclovir (VALTREX) 1000 MG tablet With symptom onset, take 2 tablets (two grams) and repeat in 12 hours.   No facility-administered encounter medications on file as of 09/09/2022.    Follow-up: Return in about 3 months (around 12/10/2022) for bp, chol, wt.   Wellington Hampshire, MD

## 2022-09-12 ENCOUNTER — Encounter: Payer: Self-pay | Admitting: Family Medicine

## 2022-09-18 ENCOUNTER — Other Ambulatory Visit: Payer: Self-pay | Admitting: *Deleted

## 2022-09-18 MED ORDER — ZEPBOUND 2.5 MG/0.5ML ~~LOC~~ SOAJ: 2.5000 mg | SUBCUTANEOUS | 0 refills | Status: DC

## 2022-09-19 ENCOUNTER — Telehealth: Payer: Self-pay

## 2022-09-19 ENCOUNTER — Other Ambulatory Visit (HOSPITAL_COMMUNITY): Payer: Self-pay

## 2022-09-19 NOTE — Telephone Encounter (Signed)
PA request received via CMM for Icosapent Ethyl 1GM capsules  PA has been submitted to [facility] and is pending determination.  Key: [id number]

## 2022-09-23 NOTE — Telephone Encounter (Signed)
Pharmacy Patient Advocate Encounter  Received notification from [facility] that the request for prior authorization for Icosapent Ethyl 1gm  has been denied due to unable to approve this request because coverage of the requested medication is provided when the patient has tried, or is currently receiving one of the following products: niacin, a fibrate, or a statin.    Please be advised we currently do not have a Pharmacist to review denials, therefore you will need to process appeals accordingly as needed. Thanks for your support at this time.   You may call [phone]  to appeal.   Denial letter attached to chart

## 2022-09-23 NOTE — Telephone Encounter (Signed)
FYI

## 2022-09-24 ENCOUNTER — Encounter: Payer: Self-pay | Admitting: Family Medicine

## 2022-09-24 ENCOUNTER — Other Ambulatory Visit: Payer: Self-pay | Admitting: *Deleted

## 2022-09-24 MED ORDER — FENOFIBRATE 160 MG PO TABS: 160.0000 mg | ORAL_TABLET | Freq: Every day | ORAL | 1 refills | Status: DC

## 2022-09-24 NOTE — Telephone Encounter (Signed)
Rx sent to the pharmacy. Patient notified. 

## 2022-10-02 ENCOUNTER — Telehealth: Payer: Self-pay | Admitting: Family Medicine

## 2022-10-02 NOTE — Telephone Encounter (Signed)
Please see message below and schedule patient for tomorrow at 1pm. Thanks

## 2022-10-02 NOTE — Telephone Encounter (Signed)
Patient sent appointment request stating she has gotten new BP machine and readings have been elevated. States yesterday BP was 157/100 and this morning was 151/100. Pt is unsure if she is doing it correctly or if it's just false readings. States she is having no symptoms. Please Advise.

## 2022-10-03 ENCOUNTER — Ambulatory Visit: Payer: BC Managed Care – PPO | Admitting: Family Medicine

## 2022-10-03 ENCOUNTER — Encounter: Payer: Self-pay | Admitting: Family Medicine

## 2022-10-03 VITALS — BP 134/80 | HR 88 | Temp 98.1°F | Ht 65.0 in | Wt 204.1 lb

## 2022-10-03 DIAGNOSIS — R030 Elevated blood-pressure reading, without diagnosis of hypertension: Secondary | ICD-10-CM | POA: Diagnosis not present

## 2022-10-03 DIAGNOSIS — E782 Mixed hyperlipidemia: Secondary | ICD-10-CM

## 2022-10-03 MED ORDER — ZEPBOUND 5 MG/0.5ML ~~LOC~~ SOAJ: 5.0000 mg | SUBCUTANEOUS | 0 refills | Status: DC

## 2022-10-03 NOTE — Progress Notes (Signed)
Subjective:     Patient ID: CHARITY DEVIVO, female    DOB: 10/29/1970, 52 y.o.   MRN: [id number]  Chief Complaint  Patient presents with   Medical Management of Chronic Issues    High blood pressure reading on home blood pressure cuff, 149/102     HPI  Elevated blood pressure's. Got new cuff.  Seen 3/19 and decided on lifestyle changes and monitoring.  Blood pressure's running 150's/100's. No headache(s)/chest pain/dizziness/shortness of breath.  Walking 12min/day at least.  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  Sleeping well.   Zepbound for 2 weeks.  Nausea and headache(s) this am-ate and better. HLD-taking fish oil and fenofibrate  There are no preventive care reminders to display for this patient.  Past Medical History:  Diagnosis Date   Abnormal Pap smear    Cancer    breast cancer   Dog bite 04/2017   Family history of breast cancer 03/23/2020   Family history of prostate cancer 03/23/2020   Personal history of radiation therapy     Past Surgical History:  Procedure Laterality Date   ABDOMINAL HYSTERECTOMY     endometriosis   USO   BREAST BIOPSY Right 02/2020   BREAST LUMPECTOMY Right 04/2020   BREAST LUMPECTOMY WITH RADIOACTIVE SEED LOCALIZATION Right 04/27/2020   Procedure: RIGHT BREAST LUMPECTOMY WITH RADIOACTIVE SEED LOCALIZATION;  Surgeon: Griselda Miner, MD;  Location: [facility];  Service: General;  Laterality: Right;   BREAST REDUCTION SURGERY Bilateral 04/27/2020   Procedure: ONCOPLASTIC BREAST BILATERAL REDUCTION;  Surgeon: Peggye Form, DO;  Location: [facility];  Service: Plastics;  Laterality: Bilateral;   CYSTOSCOPY  12/30/2011   Procedure: CYSTOSCOPY;  Surgeon: Annamaria Boots, MD;  Location: [facility];  Service: Gynecology;  Laterality: N/A;   REDUCTION MAMMAPLASTY Left 04/2020   ROBOTIC ASSISTED LAP VAGINAL HYSTERECTOMY     SALPINGOOPHORECTOMY  12/30/2011   Procedure: SALPINGO OOPHERECTOMY;  Surgeon: Annamaria Boots,  MD;  Location: [facility];  Service: Gynecology;  Laterality: Left;   TUBAL LIGATION      Outpatient Medications Prior to Visit  Medication Sig Dispense Refill   Cholecalciferol (D3-1000) 25 MCG (1000 UT) tablet Take 1,000 Units by mouth daily. 2 tablets daily     fenofibrate 160 MG tablet Take 1 tablet (160 mg total) by mouth daily. 90 tablet 1   Multiple Vitamin (MULTIVITAMIN PO) Take by mouth.     Omega-3 Fatty Acids (FISH OIL) 300 MG CAPS Take 2,000 mg by mouth in the morning and at bedtime.     tamoxifen (NOLVADEX) 20 MG tablet Take 1 tablet (20 mg total) by mouth daily. 90 tablet 3   valACYclovir (VALTREX) 1000 MG tablet With symptom onset, take 2 tablets (two grams) and repeat in 12 hours. 30 tablet 4   icosapent Ethyl (VASCEPA) 1 g capsule Take 2 capsules (2 g total) by mouth 2 (two) times daily. 360 capsule 1   tirzepatide (ZEPBOUND) 2.5 MG/0.5ML Pen Inject 2.5 mg into the skin once a week. 2 mL 0   No facility-administered medications prior to visit.    Allergies  Allergen Reactions   Phentermine Palpitations   Penicillins Other (See Comments)    Pt doesn't remember reaction to PCN- was in childhood   Sulfa Antibiotics Rash   ROS neg/noncontributory except as noted HPI/below      Objective:     BP 134/80   Pulse 88   Temp 98.1 F (36.7 C) (Temporal)   Ht   (1.651 m)   Wt 204 lb 2 oz (92.6 kg)   LMP 11/17/2011   SpO2 99%   BMI 33.97 kg/m  Wt Readings from Last 3 Encounters:  10/03/22 204 lb 2 oz (92.6 kg)  09/09/22 206 lb 2 oz (93.5 kg)  09/02/22 214 lb (97.1 kg)    Physical Exam  Patient cuff 149/102.  Ours 128/80 Gen: WDWN NAD HEENT: NCAT, conjunctiva not injected, sclera nonicteric CARDIAC: RRR, S1S2+, no murmur.  MSK: no gross abnormalities.  NEURO: A&O x3.  CN II-XII intact.  PSYCH: normal mood. Good eye contact     Assessment & Plan:   Problem List Items Addressed This Visit       Other   Hyperlipidemia, mixed   Other Visit Diagnoses      Elevated blood pressure reading    -  Primary      Elevated blood pressure reading-patient cuff seems to be inaccurate   I even checked w/small cuff and still got same reading.  Has work Engineer, civil (consulting), advised to get blood pressure checked there.  Continue diet/exercise.   Has follow up in June HLD-mixed-continue medications.   Obesity-doing ok on zepbound.  [facility].  Will send the 5 mg dose.  Call in 1 month(s) for next dose vs continue on 5.   Meds ordered this encounter  Medications   tirzepatide (ZEPBOUND) 5 MG/0.5ML Pen    Sig: Inject 5 mg into the skin once a week.    Dispense:  2 mL    Refill:  0    [name], MD

## 2022-10-09 ENCOUNTER — Encounter: Payer: Self-pay | Admitting: Family Medicine

## 2022-10-10 ENCOUNTER — Other Ambulatory Visit: Payer: Self-pay | Admitting: Family Medicine

## 2022-10-10 MED ORDER — ZEPBOUND 2.5 MG/0.5ML ~~LOC~~ SOAJ: 2.5000 mg | SUBCUTANEOUS | 0 refills | Status: DC

## 2022-10-20 ENCOUNTER — Other Ambulatory Visit: Payer: Self-pay | Admitting: *Deleted

## 2022-10-20 MED ORDER — FENOFIBRATE 160 MG PO TABS: 160.0000 mg | ORAL_TABLET | Freq: Every day | ORAL | 1 refills | Status: DC

## 2022-10-24 ENCOUNTER — Other Ambulatory Visit: Payer: Self-pay | Admitting: *Deleted

## 2022-10-24 MED ORDER — ZEPBOUND 2.5 MG/0.5ML ~~LOC~~ SOAJ: 2.5000 mg | SUBCUTANEOUS | 0 refills | Status: DC

## 2022-11-07 ENCOUNTER — Other Ambulatory Visit: Payer: Self-pay

## 2022-11-07 MED ORDER — ZEPBOUND 5 MG/0.5ML ~~LOC~~ SOAJ: 5.0000 mg | SUBCUTANEOUS | 0 refills | Status: DC

## 2022-11-21 ENCOUNTER — Other Ambulatory Visit: Payer: Self-pay

## 2022-11-21 ENCOUNTER — Telehealth: Payer: Self-pay | Admitting: Family Medicine

## 2022-11-21 MED ORDER — ZEPBOUND 5 MG/0.5ML ~~LOC~~ SOAJ: 5.0000 mg | SUBCUTANEOUS | 0 refills | Status: DC

## 2022-11-21 NOTE — Telephone Encounter (Signed)
Rx sent to requested pharmacy per pt request

## 2022-11-21 NOTE — Telephone Encounter (Signed)
Patient called in asking if Zepbound 5 mg could be called into [facility] on [address]. States original pharmacy sent to informed her they were out but [facility] in [place] has one left. Due to PCP and her CMA being out I was able to ask Kela to send in medication.

## 2022-12-10 ENCOUNTER — Other Ambulatory Visit (HOSPITAL_COMMUNITY): Payer: Self-pay

## 2022-12-10 ENCOUNTER — Ambulatory Visit: Payer: BC Managed Care – PPO | Admitting: Family Medicine

## 2022-12-10 ENCOUNTER — Encounter: Payer: Self-pay | Admitting: Family Medicine

## 2022-12-10 VITALS — BP 110/80 | HR 83 | Temp 98.0°F | Resp 18 | Ht 65.0 in | Wt 197.0 lb

## 2022-12-10 DIAGNOSIS — R030 Elevated blood-pressure reading, without diagnosis of hypertension: Secondary | ICD-10-CM

## 2022-12-10 DIAGNOSIS — E6609 Other obesity due to excess calories: Secondary | ICD-10-CM

## 2022-12-10 DIAGNOSIS — E782 Mixed hyperlipidemia: Secondary | ICD-10-CM

## 2022-12-10 DIAGNOSIS — Z23 Encounter for immunization: Secondary | ICD-10-CM | POA: Diagnosis not present

## 2022-12-10 DIAGNOSIS — Z6832 Body mass index (BMI) 32.0-32.9, adult: Secondary | ICD-10-CM | POA: Diagnosis not present

## 2022-12-10 LAB — COMPREHENSIVE METABOLIC PANEL
ALT: 19 U/L (ref 0–35)
AST: 18 U/L (ref 0–37)
Albumin: 4.5 g/dL (ref 3.5–5.2)
Alkaline Phosphatase: 52 U/L (ref 39–117)
BUN: 13 mg/dL (ref 6–23)
CO2: 25 mEq/L (ref 19–32)
Calcium: 9.2 mg/dL (ref 8.4–10.5)
Chloride: 106 mEq/L (ref 96–112)
Creatinine, Ser: 0.79 mg/dL (ref 0.40–1.20)
GFR: 86.96 mL/min (ref >60.00)
Glucose, Bld: 97 mg/dL (ref 70–99)
Potassium: 4.1 mEq/L (ref 3.5–5.1)
Sodium: 139 mEq/L (ref 135–145)
Total Bilirubin: 0.4 mg/dL (ref 0.2–1.2)
Total Protein: 7.6 g/dL (ref 6.0–8.3)

## 2022-12-10 LAB — LIPID PANEL
Cholesterol: 158 mg/dL (ref 0–200)
HDL: 27.1 mg/dL — ABNORMAL LOW (ref >39.00)
LDL Cholesterol: 101 mg/dL — ABNORMAL HIGH (ref 0–99)
NonHDL: 131.11
Total CHOL/HDL Ratio: 6
Triglycerides: 149 mg/dL (ref 0.0–149.0)
VLDL: 29.8 mg/dL (ref 0.0–40.0)

## 2022-12-10 MED ORDER — ZEPBOUND 7.5 MG/0.5ML ~~LOC~~ SOAJ
7.5000 mg | SUBCUTANEOUS | 0 refills | Status: DC
Start: 2022-12-10 — End: 2023-01-12

## 2022-12-10 NOTE — Patient Instructions (Signed)
It was very nice to see you today!  Keep up the great work!   PLEASE NOTE:  If you had any lab tests please let us know if you have not heard back within a few days. You may see your results on MyChart before we have a chance to review them but we will give you a call once they are reviewed by us. If we ordered any referrals today, please let us know if you have not heard from their office within the next week.   Please try these tips to maintain a healthy lifestyle:  Eat most of your calories during the day when you are active. Eliminate processed foods including packaged sweets (pies, cakes, cookies), reduce intake of potatoes, white bread, white pasta, and white rice. Look for whole grain options, oat flour or almond flour.  Each meal should contain half fruits/vegetables, one quarter protein, and one quarter carbs (no bigger than a computer mouse).  Cut down on sweet beverages. This includes juice, soda, and sweet tea. Also watch fruit intake, though this is a healthier sweet option, it still contains natural sugar! Limit to 3 servings daily.  Drink at least 1 glass of water with each meal and aim for at least 8 glasses per day  Exercise at least 150 minutes every week.   

## 2022-12-10 NOTE — Assessment & Plan Note (Signed)
Chronic.  Newer dx.  On fenofibrate 160mg  daily

## 2022-12-10 NOTE — Progress Notes (Signed)
Triglycerides are much improved!.  Continue meds and diet/exercise.  HDL is still low-continue exercise.

## 2022-12-10 NOTE — Progress Notes (Signed)
Subjective:     Patient ID: Catherine Santos, female    DOB: April 14, 1971, 52 y.o.   MRN: [id number]  Chief Complaint  Patient presents with   Medical Management of Chronic Issues    3 month follow-up for htn, cholesterol, and weight Fasting     HPI  Elevated blood pressures-walking 30+min/day.  Working on diet.  Running 112-133/78-95(usu low-mid 80's).  No symptoms HLD-taking fish oil and fenofibrate-no issues.  Working on diet/exercise Obesity-on zepbound 5mg -doing well.  Hard to find at times.  No SI. No SE  Wt 206 on 3/19.  197 today. Down 9#  There are no preventive care reminders to display for this patient.  Past Medical History:  Diagnosis Date   Abnormal Pap smear    Cancer (HCC)    breast cancer   Dog bite 04/2017   Family history of breast cancer 03/23/2020   Family history of prostate cancer 03/23/2020   Personal history of radiation therapy     Past Surgical History:  Procedure Laterality Date   ABDOMINAL HYSTERECTOMY     endometriosis   USO   BREAST BIOPSY Right 02/2020   BREAST LUMPECTOMY Right 04/2020   BREAST LUMPECTOMY WITH RADIOACTIVE SEED LOCALIZATION Right 04/27/2020   Procedure: RIGHT BREAST LUMPECTOMY WITH RADIOACTIVE SEED LOCALIZATION;  Surgeon: Griselda Miner, MD;  Location: [facility];  Service: General;  Laterality: Right;   BREAST REDUCTION SURGERY Bilateral 04/27/2020   Procedure: ONCOPLASTIC BREAST BILATERAL REDUCTION;  Surgeon: Peggye Form, DO;  Location: [facility];  Service: Plastics;  Laterality: Bilateral;   CYSTOSCOPY  12/30/2011   Procedure: CYSTOSCOPY;  Surgeon: Annamaria Boots, MD;  Location: [facility];  Service: Gynecology;  Laterality: N/A;   REDUCTION MAMMAPLASTY Left 04/2020   ROBOTIC ASSISTED LAP VAGINAL HYSTERECTOMY     SALPINGOOPHORECTOMY  12/30/2011   Procedure: SALPINGO OOPHERECTOMY;  Surgeon: Annamaria Boots, MD;  Location: [facility];  Service: Gynecology;  Laterality: Left;   TUBAL  LIGATION       Current Outpatient Medications:    Cholecalciferol (D3-1000) 25 MCG (1000 UT) tablet, Take 1,000 Units by mouth daily. 2 tablets daily, Disp: , Rfl:    fenofibrate 160 MG tablet, Take 1 tablet (160 mg total) by mouth daily., Disp: 90 tablet, Rfl: 1   Multiple Vitamin (MULTIVITAMIN PO), Take by mouth., Disp: , Rfl:    Omega-3 Fatty Acids (FISH OIL) 300 MG CAPS, Take 2,000 mg by mouth in the morning and at bedtime., Disp: , Rfl:    tamoxifen (NOLVADEX) 20 MG tablet, Take 1 tablet (20 mg total) by mouth daily., Disp: 90 tablet, Rfl: 3   tirzepatide (ZEPBOUND) 7.5 MG/0.5ML Pen, Inject 7.5 mg into the skin once a week., Disp: 2 mL, Rfl: 0   valACYclovir (VALTREX) 1000 MG tablet, With symptom onset, take 2 tablets (two grams) and repeat in 12 hours., Disp: 30 tablet, Rfl: 4  Allergies  Allergen Reactions   Phentermine Palpitations   Penicillins Other (See Comments)    Pt doesn't remember reaction to PCN- was in childhood   Sulfa Antibiotics Rash   ROS neg/noncontributory except as noted HPI/below      Objective:     BP 110/80   Pulse 83   Temp 98 F (36.7 C) (Temporal)   Resp 18   Ht 5\' 5"  (1.651 m)   Wt 197 lb (89.4 kg)   LMP 11/17/2011   SpO2 99%   BMI 32.78 kg/m  Wt Readings from  Last 3 Encounters:  12/10/22 197 lb (89.4 kg)  10/03/22 204 lb 2 oz (92.6 kg)  09/09/22 206 lb 2 oz (93.5 kg)    Physical Exam   Gen: WDWN NAD HEENT: NCAT, conjunctiva not injected, sclera nonicteric NECK:  supple, no thyromegaly, no nodes, no carotid bruits CARDIAC: RRR, S1S2+, no murmur. LUNGS: CTAB. No wheezes ABDOMEN:  BS+, soft, NTND, No HSM, no masses EXT:  no edema MSK: no gross abnormalities.  NEURO: A&O x3.  CN II-XII intact.  PSYCH: normal mood. Good eye contact     Assessment & Plan:  Hyperlipidemia, mixed Assessment & Plan: Chronic.  Newer dx.  On fenofibrate 160mg  daily  Orders: -     Comprehensive metabolic panel -     Lipid panel  Elevated blood  pressure reading  Class 1 obesity due to excess calories with serious comorbidity and body mass index (BMI) of 32.0 to 32.9 in adult -     Zepbound; Inject 7.5 mg into the skin once a week.  Dispense: 2 mL; Refill: 0  Need for shingles vaccine -     Varicella-zoster vaccine IM  2.  Elevated blood pressures-working on diet/exercise 3.  Obesity-zepbound working well.  Increase to 7.5mg  weekly then in 1 mo, she will message for the 10mg .   Continue diet/exercise!  Return in about 3 months (around 03/12/2023) for wt, bp.  Catherine Sole, MD

## 2022-12-13 ENCOUNTER — Encounter: Payer: Self-pay | Admitting: Family Medicine

## 2022-12-18 DIAGNOSIS — D485 Neoplasm of uncertain behavior of skin: Secondary | ICD-10-CM | POA: Diagnosis not present

## 2022-12-18 DIAGNOSIS — B079 Viral wart, unspecified: Secondary | ICD-10-CM | POA: Diagnosis not present

## 2023-01-10 ENCOUNTER — Other Ambulatory Visit: Payer: Self-pay | Admitting: Family Medicine

## 2023-01-10 DIAGNOSIS — E6609 Other obesity due to excess calories: Secondary | ICD-10-CM

## 2023-01-12 ENCOUNTER — Other Ambulatory Visit: Payer: Self-pay | Admitting: Family Medicine

## 2023-01-12 DIAGNOSIS — E6609 Other obesity due to excess calories: Secondary | ICD-10-CM

## 2023-01-12 MED ORDER — ZEPBOUND 10 MG/0.5ML ~~LOC~~ SOAJ: 10.0000 mg | SUBCUTANEOUS | 1 refills | Status: DC

## 2023-02-02 ENCOUNTER — Encounter: Payer: Self-pay | Admitting: Family Medicine

## 2023-02-02 ENCOUNTER — Other Ambulatory Visit: Payer: Self-pay | Admitting: Radiology

## 2023-02-02 DIAGNOSIS — E6609 Other obesity due to excess calories: Secondary | ICD-10-CM

## 2023-02-02 MED ORDER — ZEPBOUND 15 MG/0.5ML ~~LOC~~ SOAJ
15.0000 mg | SUBCUTANEOUS | 11 refills | Status: DC
Start: 2023-02-02 — End: 2023-02-04

## 2023-02-02 MED ORDER — ZEPBOUND 12.5 MG/0.5ML ~~LOC~~ SOAJ
12.5000 mg | SUBCUTANEOUS | 0 refills | Status: AC
Start: 2023-02-02 — End: ?

## 2023-02-03 ENCOUNTER — Other Ambulatory Visit (HOSPITAL_BASED_OUTPATIENT_CLINIC_OR_DEPARTMENT_OTHER): Payer: Self-pay | Admitting: Obstetrics & Gynecology

## 2023-02-03 DIAGNOSIS — Z1211 Encounter for screening for malignant neoplasm of colon: Secondary | ICD-10-CM

## 2023-02-04 ENCOUNTER — Other Ambulatory Visit: Payer: Self-pay | Admitting: Family Medicine

## 2023-02-04 MED ORDER — ZEPBOUND 10 MG/0.5ML ~~LOC~~ SOAJ: 10.0000 mg | SUBCUTANEOUS | 0 refills | Status: DC

## 2023-02-12 ENCOUNTER — Ambulatory Visit: Payer: BC Managed Care – PPO

## 2023-02-17 ENCOUNTER — Ambulatory Visit: Admission: RE | Admit: 2023-02-17 | Payer: BC Managed Care – PPO | Source: Ambulatory Visit

## 2023-02-17 DIAGNOSIS — D0511 Intraductal carcinoma in situ of right breast: Secondary | ICD-10-CM

## 2023-02-17 DIAGNOSIS — Z1231 Encounter for screening mammogram for malignant neoplasm of breast: Secondary | ICD-10-CM | POA: Diagnosis not present

## 2023-03-02 DIAGNOSIS — Z1211 Encounter for screening for malignant neoplasm of colon: Secondary | ICD-10-CM | POA: Diagnosis not present

## 2023-03-08 ENCOUNTER — Other Ambulatory Visit: Payer: Self-pay | Admitting: Family Medicine

## 2023-03-25 ENCOUNTER — Ambulatory Visit: Payer: BC Managed Care – PPO | Admitting: Family Medicine

## 2023-04-03 ENCOUNTER — Other Ambulatory Visit: Payer: Self-pay | Admitting: *Deleted

## 2023-04-03 MED ORDER — TAMOXIFEN CITRATE 20 MG PO TABS: 20.0000 mg | ORAL_TABLET | Freq: Every day | ORAL | 3 refills | Status: DC

## 2023-04-06 ENCOUNTER — Other Ambulatory Visit: Payer: Self-pay | Admitting: Family Medicine

## 2023-04-09 NOTE — Progress Notes (Signed)
Subjective:     Patient ID: Catherine Santos, female    DOB: 12/31/1970, 52 y.o.   MRN: [id number]  Chief Complaint  Patient presents with   Hypertension   Hyperlipidemia   Weight Check    Pt here for f.u on on weight loss meds w/o any complaints     HPI  Elevated BP - Headaches associated to elevated BP have resolved. Has not been monitoring her BP at home since she has been feeling significantly better. Reports a reading of 122/80 in late July/August.  Weight management - Has been walking and recently started pilates. Pt has lost 12 lbs since her last visit. Started Zepbound in April, has lost about 30 lbs since starting. She reports feeling sick for some time while taking 7.5 mg. Since her dose increased to 10 mg, she tends to feel sick for 1 day following her injection. Otherwise, no intolerances or adverse side effects. She states she has not been able to obtain her 3 month supply of Zepbound(which is what ins wants-just 1 month), took her last injection today. Drinking plenty of water daily.   HLD - Pt taking omega 3 fish oils 2,000 mg daily and fenofibrate 160 mg daily.   Will follow up with oncology on 04/23/23 and with gyn in 08/2023.   There are no preventive care reminders to display for this patient.   Past Medical History:  Diagnosis Date   Abnormal Pap smear    Cancer (HCC)    breast cancer   Dog bite 04/2017   Family history of breast cancer 03/23/2020   Family history of prostate cancer 03/23/2020   Personal history of radiation therapy     Past Surgical History:  Procedure Laterality Date   ABDOMINAL HYSTERECTOMY     endometriosis   USO   BREAST BIOPSY Right 02/2020   BREAST LUMPECTOMY Right 04/2020   BREAST LUMPECTOMY WITH RADIOACTIVE SEED LOCALIZATION Right 04/27/2020   Procedure: RIGHT BREAST LUMPECTOMY WITH RADIOACTIVE SEED LOCALIZATION;  Surgeon: Griselda Miner, MD;  Location: [facility];  Service: General;  Laterality: Right;    BREAST REDUCTION SURGERY Bilateral 04/27/2020   Procedure: ONCOPLASTIC BREAST BILATERAL REDUCTION;  Surgeon: Peggye Form, DO;  Location: [facility];  Service: Plastics;  Laterality: Bilateral;   CYSTOSCOPY  12/30/2011   Procedure: CYSTOSCOPY;  Surgeon: Annamaria Boots, MD;  Location: [facility];  Service: Gynecology;  Laterality: N/A;   REDUCTION MAMMAPLASTY Left 04/2020   ROBOTIC ASSISTED LAP VAGINAL HYSTERECTOMY     SALPINGOOPHORECTOMY  12/30/2011   Procedure: SALPINGO OOPHERECTOMY;  Surgeon: Annamaria Boots, MD;  Location: [facility];  Service: Gynecology;  Laterality: Left;   TUBAL LIGATION       Current Outpatient Medications:    Cholecalciferol (D3-1000) 25 MCG (1000 UT) tablet, Take 1,000 Units by mouth daily. 2 tablets daily, Disp: , Rfl:    Multiple Vitamin (MULTIVITAMIN PO), Take by mouth., Disp: , Rfl:    Omega-3 Fatty Acids (FISH OIL) 300 MG CAPS, Take 2,000 mg by mouth in the morning and at bedtime., Disp: , Rfl:    tamoxifen (NOLVADEX) 20 MG tablet, Take 1 tablet (20 mg total) by mouth daily., Disp: 90 tablet, Rfl: 3   valACYclovir (VALTREX) 1000 MG tablet, With symptom onset, take 2 tablets (two grams) and repeat in 12 hours., Disp: 30 tablet, Rfl: 4   tirzepatide (ZEPBOUND) 10 MG/0.5ML Pen, Inject 10 mg into the skin once a week., Disp: 6 mL, Rfl:  1  Allergies  Allergen Reactions   Phentermine Palpitations   Penicillins Other (See Comments)    Pt doesn't remember reaction to PCN- was in childhood   Sulfa Antibiotics Rash   ROS neg/noncontributory except as noted HPI/below      Objective:     BP 122/80   Pulse 87   Temp 98.3 F (36.8 C)   Ht 5\' 5"  (1.651 m)   Wt 185 lb 9.6 oz (84.2 kg)   LMP 11/17/2011   SpO2 98%   BMI 30.89 kg/m  Wt Readings from Last 3 Encounters:  04/10/23 185 lb 9.6 oz (84.2 kg)  12/10/22 197 lb (89.4 kg)  10/03/22 204 lb 2 oz (92.6 kg)    Physical Exam   Gen: WDWN NAD HEENT: NCAT, conjunctiva not  injected, sclera nonicteric NECK:  supple, no thyromegaly, no nodes, no carotid bruits CARDIAC: RRR, S1S2+, no murmur. DP 2+B LUNGS: CTAB. No wheezes ABDOMEN:  BS+, soft, NTND, No HSM, no masses EXT:  no edema MSK: no gross abnormalities.  NEURO: A&O x3.  CN II-XII intact.  PSYCH: normal mood. Good eye contact     Assessment & Plan:  Class 1 obesity due to excess calories with serious comorbidity and body mass index (BMI) of 30.0 to 30.9 in adult -     Zepbound; Inject 10 mg into the skin once a week.  Dispense: 6 mL; Refill: 1  Hyperlipidemia, mixed Assessment & Plan: Chronic.  Newer dx.  On fenofibrate 160mg  daily and fish oil 4/day.  Also on zepbound and working hard on diet/exercise.  Will stop fenofibrate and reck 1-2 months to see if still needs.   Orders: -     Comprehensive metabolic panel -     Lipid panel  Need for influenza vaccination -     Flu vaccine trivalent PF, 6mos and older(Flulaval,Afluria,Fluarix,Fluzone)  Need for shingles vaccine -     Varicella-zoster vaccine IM  2.  Obesity-doing very well on zepbound 10mg  weekly.  Will continue and continue diet/exercise.  No need to increase unless plateaus and can't resolve  Return in about 6 months (around 10/09/2023) for cholesterol, wt.    labs in 1-2 months.   I, Isabelle Course, acting as a scribe for [name], MD., have documented all relevant documentation on the behalf of [name], MD, as directed by  [name], MD while in the presence of [name], MD.  I, [name], MD, have reviewed all documentation for this visit. The documentation on 04/10/23 for the exam, diagnosis, procedures, and orders are all accurate and complete.    [name], MD

## 2023-04-10 ENCOUNTER — Ambulatory Visit: Payer: BC Managed Care – PPO | Admitting: Family Medicine

## 2023-04-10 ENCOUNTER — Encounter: Payer: Self-pay | Admitting: Family Medicine

## 2023-04-10 VITALS — BP 122/80 | HR 87 | Temp 98.3°F | Ht 65.0 in | Wt 185.6 lb

## 2023-04-10 DIAGNOSIS — Z23 Encounter for immunization: Secondary | ICD-10-CM | POA: Diagnosis not present

## 2023-04-10 DIAGNOSIS — E66811 Obesity, class 1: Secondary | ICD-10-CM

## 2023-04-10 DIAGNOSIS — E6609 Other obesity due to excess calories: Secondary | ICD-10-CM

## 2023-04-10 DIAGNOSIS — Z6830 Body mass index (BMI) 30.0-30.9, adult: Secondary | ICD-10-CM

## 2023-04-10 DIAGNOSIS — E782 Mixed hyperlipidemia: Secondary | ICD-10-CM | POA: Diagnosis not present

## 2023-04-10 MED ORDER — ZEPBOUND 10 MG/0.5ML ~~LOC~~ SOAJ
10.0000 mg | SUBCUTANEOUS | 1 refills | Status: DC
Start: 2023-04-10 — End: 2023-08-11

## 2023-04-10 NOTE — Assessment & Plan Note (Signed)
Chronic.  Newer dx.  On fenofibrate 160mg  daily and fish oil 4/day.  Also on zepbound and working hard on diet/exercise.  Will stop fenofibrate and reck 1-2 months to see if still needs.

## 2023-04-10 NOTE — Patient Instructions (Addendum)
It was very nice to see you today!  Stop fenofibrate  PLEASE NOTE:  If you had any lab tests please let us know if you have not heard back within a few days. You may see your results on MyChart before we have a chance to review them but we will give you a call once they are reviewed by [place]. If we ordered any referrals today, please let us know if you have not heard from their office within the next week.   Please try these tips to maintain a healthy lifestyle:  Eat most of your calories during the day when you are active. Eliminate processed foods including packaged sweets (pies, cakes, cookies), reduce intake of potatoes, white bread, white pasta, and white rice. Look for whole grain options, oat flour or almond flour.  Each meal should contain half fruits/vegetables, one quarter protein, and one quarter carbs (no bigger than a computer mouse).  Cut down on sweet beverages. This includes juice, soda, and sweet tea. Also watch fruit intake, though this is a healthier sweet option, it still contains natural sugar! Limit to 3 servings daily.  Drink at least 1 glass of water with each meal and aim for at least 8 glasses per day  Exercise at least 150 minutes every week.

## 2023-04-21 ENCOUNTER — Telehealth: Payer: Self-pay

## 2023-04-21 ENCOUNTER — Other Ambulatory Visit (HOSPITAL_COMMUNITY): Payer: Self-pay

## 2023-04-21 NOTE — Telephone Encounter (Signed)
Pharmacy Patient Advocate Encounter   Received notification from CoverMyMeds that prior authorization for Zepbound is required/requested.   Insurance verification completed.   The patient is insured through [employer] .   Per test claim: PA required; PA submitted to above mentioned insurance via CoverMyMeds Key/confirmation #/EOC Key: [id number] Status is pending

## 2023-04-23 ENCOUNTER — Inpatient Hospital Stay: Payer: BC Managed Care – PPO | Attending: Hematology and Oncology | Admitting: Hematology and Oncology

## 2023-04-23 ENCOUNTER — Other Ambulatory Visit (HOSPITAL_COMMUNITY): Payer: Self-pay

## 2023-04-23 VITALS — BP 119/82 | HR 92 | Temp 97.7°F | Resp 16 | Wt 189.6 lb

## 2023-04-23 DIAGNOSIS — Z79810 Long term (current) use of selective estrogen receptor modulators (SERMs): Secondary | ICD-10-CM | POA: Diagnosis not present

## 2023-04-23 DIAGNOSIS — Z87891 Personal history of nicotine dependence: Secondary | ICD-10-CM | POA: Insufficient documentation

## 2023-04-23 DIAGNOSIS — D0511 Intraductal carcinoma in situ of right breast: Secondary | ICD-10-CM | POA: Diagnosis not present

## 2023-04-23 NOTE — Telephone Encounter (Signed)
Pharmacy Patient Advocate Encounter  Received notification from [employer] that Prior Authorization for Zepbound has been APPROVED from 10.1.24 to 6.28.25. Ran test claim, and the drug is next Payable on/after 12.18.24. This test claim was processed through [facility]- copay amounts may vary at other pharmacies due to pharmacy/plan contracts, or as the patient moves through the different stages of their insurance plan.   PA #/Case ID/Reference #: Key: [id number]

## 2023-04-23 NOTE — Progress Notes (Signed)
[facility]  Telephone:([phone]) [phone] Fax:(336) [phone]     ID: Catherine Santos DOB: Oct 30, 1970  MR#: [id number]  JYN#:[id number]  Patient Care Team: Jeani Sow, MD as PCP - General (Family Medicine) Pershing Proud, RN as Oncology Nurse Navigator Donnelly Angelica, RN as Oncology Nurse Navigator Magrinat, Valentino Hue, MD (Inactive) as Consulting Physician (Oncology) Griselda Miner, MD as Consulting Physician (General Surgery) Lonie Peak, MD as Attending Physician (Radiation Oncology) Jerene Bears, MD as Consulting Physician (Gynecology) Dillingham, Alena Bills, DO as Attending Physician (Plastic Surgery) Rachel Moulds, MD  CHIEF COMPLAINT: noninvasive breast cancer, estrogen receptor positive  CURRENT TREATMENT: Tamoxifen  INTERVAL HISTORY:  Discussed the use of AI scribe software for clinical note transcription with the patient, who gave verbal consent to proceed.  History of Present Illness        The patient, with a history of breast cancer on tamoxifen therapy, presents for a follow-up visit. She reports significant weight loss of almost thirty pounds, which she attributes to a combination of lifestyle changes and the use of Zepbound (Tirzepatide). She notes that she has been able to discontinue her cholesterol medication due to the weight loss and her cholesterol levels have improved. She also reports that her blood pressure has returned to normal. She is otherwise tolerating tamoxifen very well. She denies any complaints with it. Rest of the pertinent 10 point ROS reviewed and neg.  REVIEW OF SYSTEMS:   COVID 19 VACCINATION STATUS: s/p Pfizer x 3 as of October 2022   HISTORY OF CURRENT ILLNESS: From Dr. Darrall Dears original intake note:   "Catherine Santos 52 y.o. female is here because of recent diagnosis of right breast ductal carcinoma in situ. Screening mammogram on 02/08/20 detected right breast calcifications. Diagnostic mammogram on 02/23/20 showed a  0.9cm group of right breast calcifications. 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 Carolynne Edouard. Pathology from the procedure [phone]) showed: ductal carcinoma in situ, high grade, with necrosis; margins negative.  The patient's subsequent history is as detailed below.   PAST MEDICAL HISTORY: Past Medical History:  Diagnosis Date   Abnormal Pap smear    Cancer (HCC)    breast cancer   Dog bite 04/2017   Family history of breast cancer 03/23/2020   Family history of prostate cancer 03/23/2020   Personal history of radiation therapy     PAST SURGICAL HISTORY: Past Surgical History:  Procedure Laterality Date   ABDOMINAL HYSTERECTOMY     endometriosis   USO   BREAST BIOPSY Right 02/2020   BREAST LUMPECTOMY Right 04/2020   BREAST LUMPECTOMY WITH RADIOACTIVE SEED LOCALIZATION Right 04/27/2020   Procedure: RIGHT BREAST LUMPECTOMY WITH RADIOACTIVE SEED LOCALIZATION;  Surgeon: Griselda Miner, MD;  Location: [facility];  Service: General;  Laterality: Right;   BREAST REDUCTION SURGERY Bilateral 04/27/2020   Procedure: ONCOPLASTIC BREAST BILATERAL REDUCTION;  Surgeon: Peggye Form, DO;  Location: [facility];  Service: Plastics;  Laterality: Bilateral;   CYSTOSCOPY  12/30/2011   Procedure: CYSTOSCOPY;  Surgeon: Annamaria Boots, MD;  Location: [facility];  Service: Gynecology;  Laterality: N/A;   REDUCTION MAMMAPLASTY Left 04/2020   ROBOTIC ASSISTED LAP VAGINAL HYSTERECTOMY     SALPINGOOPHORECTOMY  12/30/2011   Procedure: SALPINGO OOPHERECTOMY;  Surgeon: Annamaria Boots, MD;  Location: [facility];  Service: Gynecology;  Laterality: Left;   TUBAL LIGATION  FAMILY HISTORY: Family History  Problem Relation Age of Onset   Hypertension Mother    Diabetes Mother    Thyroid disease Mother    Arthritis Mother    Prostate cancer Maternal Uncle        dx  unknown age   Cancer Maternal Uncle        unknown type; unknown age dx   Colon cancer Maternal Uncle        dx 85s   Lung cancer Paternal Grandfather        dx 23s; smoking hx   Breast cancer Cousin        maternal cousin-1st , diag 23s   Cancer Cousin        maternal cousin; GYN cancer, unknown age dx  As of November 2021 the patient's mother is 4 years old with no cancer history.  A maternal uncle had prostate cancer and another colon cancer.  A third had an unknown type of cancer.  A maternal cousin had breast cancer in her 39s and a second maternal cousin had an unknown gynecologic cancer.  The patient's father is 24 years old as of November 2021.  The paternal grandfather died from lung cancer at an advanced age.  There is no other family history of cancer to the patient's knowledge.   GYNECOLOGIC HISTORY:  Patient's last menstrual period was 11/17/2011. Menarche: 52 years old Age at first live birth: 52 years old GX P 1 LMP status post hysterectomy Contraceptive remotely, without complications HRT N/A  Hysterectomy? yes BSO? 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 She is single.  [name], 41 years old, is a Horticulturist, commercial and his wife is in medical school in [place] where her son is planning to move soon.  The patient has no grandchildren.  She lives by herself with two boxers.  She is not a church attender    ADVANCED DIRECTIVES: Not in place.  She tells me she intends to name [name] as healthcare power of attorney.  At the 06/07/2020 visit she was given the appropriate documents to complete and notarize at her discretion.   HEALTH MAINTENANCE: Social History   Tobacco Use   Smoking status: Former    Current packs/day: 0.00    Types: Cigarettes    Quit date: 2000    Years since quitting: 24.8   Smokeless tobacco: Never  Vaping Use   Vaping status: Never Used  Substance Use  Topics   Alcohol use: Not Currently    Alcohol/week: 2.0 - 3.0 standard drinks of alcohol    Types: 2 - 3 Standard drinks or equivalent per week    Comment: on weekends   Drug use: No     Colonoscopy: Cologard 01/2020 negative (performed at [facility])  PAP:  (s/p hysterectomy)  Bone density: n/a (age)   Allergies  Allergen Reactions   Phentermine Palpitations   Penicillins Other (See Comments)    Pt doesn't remember reaction to PCN- was in childhood   Sulfa Antibiotics Rash    Current Outpatient Medications  Medication Sig Dispense Refill   Cholecalciferol (D3-1000) 25 MCG (1000 UT) tablet Take 1,000 Units by mouth daily. 2 tablets daily     Multiple Vitamin (MULTIVITAMIN PO) Take by mouth.     Omega-3 Fatty Acids (FISH OIL) 300 MG CAPS Take 2,000 mg by mouth in the morning and at bedtime.  tamoxifen (NOLVADEX) 20 MG tablet Take 1 tablet (20 mg total) by mouth daily. 90 tablet 3   tirzepatide (ZEPBOUND) 10 MG/0.5ML Pen Inject 10 mg into the skin once a week. 6 mL 1   valACYclovir (VALTREX) 1000 MG tablet With symptom onset, take 2 tablets (two grams) and repeat in 12 hours. 30 tablet 4   No current facility-administered medications for this visit.    OBJECTIVE: White woman who appears well  There were no vitals filed for this visit.    There is no height or weight on file to calculate BMI.   Wt Readings from Last 3 Encounters:  04/10/23 185 lb 9.6 oz (84.2 kg)  12/10/22 197 lb (89.4 kg)  10/03/22 204 lb 2 oz (92.6 kg)      ECOG FS:1 - Symptomatic but completely ambulatory  Physical Exam Constitutional:      Appearance: Normal appearance.  Cardiovascular:     Rate and Rhythm: Normal rate and regular rhythm.  Pulmonary:     Effort: Pulmonary effort is normal.     Breath sounds: Normal breath sounds.  Chest:       Comments: Small oil cyst in the right breast in lower outer quadrant at the edge of the scar. No change Musculoskeletal:        General: Normal range of  motion.     Cervical back: Normal range of motion and neck supple. No rigidity.  Lymphadenopathy:     Cervical: No cervical adenopathy.  Neurological:     Mental Status: She is alert.  Psychiatric:        Mood and Affect: Mood normal.       LAB RESULTS:  CMP     Component Value Date/Time   NA 139 12/10/2022 0931   NA 147 (H) 08/22/2022 0935   K 4.1 12/10/2022 0931   CL 106 12/10/2022 0931   CO2 25 12/10/2022 0931   GLUCOSE 97 12/10/2022 0931   BUN 13 12/10/2022 0931   BUN 17 08/22/2022 0935   CREATININE 0.79 12/10/2022 0931   CREATININE 0.76 10/08/2021 1521   CREATININE 0.83 08/05/2016 1422   CALCIUM 9.2 12/10/2022 0931   PROT 7.6 12/10/2022 0931   PROT 7.6 08/22/2022 0935   ALBUMIN 4.5 12/10/2022 0931   ALBUMIN 4.7 08/22/2022 0935   AST 18 12/10/2022 0931   AST 15 10/08/2021 1521   ALT 19 12/10/2022 0931   ALT 16 10/08/2021 1521   ALKPHOS 52 12/10/2022 0931   BILITOT 0.4 12/10/2022 0931   BILITOT 0.3 08/22/2022 0935   BILITOT 0.2 (L) 10/08/2021 1521   GFRNONAA >60 10/08/2021 1521   GFRAA 117 02/10/2019 0920    No results found for: "TOTALPROTELP", "ALBUMINELP", "A1GS", "A2GS", "BETS", "BETA2SER", "GAMS", "MSPIKE", "SPEI"  Lab Results  Component Value Date   WBC 5.2 08/22/2022   NEUTROABS 2.2 08/22/2022   HGB 13.9 08/22/2022   HCT 40.3 08/22/2022   MCV 90 08/22/2022   PLT 325 08/22/2022    No results found for: "LABCA2"  No components found for: "ZOXWRU045"  No results for input(s): "INR" in the last 168 hours.  No results found for: "LABCA2"  No results found for: "WUJ811"  No results found for: "CAN125"  No results found for: "CAN153"  No results found for: "CA2729"  No components found for: "HGQUANT"  No results found for: "CEA1", "CEA" / No results found for: "CEA1", "CEA"   No results found for: "AFPTUMOR"  No results found for: "CHROMOGRNA"  No results found  for: "KPAFRELGTCHN", "LAMBDASER", "KAPLAMBRATIO" (kappa/lambda light  chains)  No results found for: "HGBA", "HGBA2QUANT", "HGBFQUANT", "HGBSQUAN" (Hemoglobinopathy evaluation)   No results found for: "LDH"  No results found for: "IRON", "TIBC", "IRONPCTSAT" (Iron and TIBC)  No results found for: "FERRITIN"  Urinalysis    Component Value Date/Time   BILIRUBINUR N 11/03/2017 1505   PROTEINUR N 11/03/2017 1505   UROBILINOGEN 0.2 11/03/2017 1505   NITRITE N 11/03/2017 1505   LEUKOCYTESUR Small (1+) (A) 11/03/2017 1505    STUDIES: No results found.   ELIGIBLE FOR AVAILABLE RESEARCH PROTOCOL: AET  ASSESSMENT: 52 y.o. Catherine Santos woman status post right breast biopsy 03/07/2020 for ductal carcinoma in situ, intermediate to high-grade, estrogen and progesterone receptor positive.  (1) genetics testing 03/28/2020 through the [facility] Common Hereditary Cancers Panel found no deleterious mutations in APC, ATM, AXIN2, BARD1, BMPR1A, BRCA1, BRCA2, BRIP1, CDH1, CDK4, CDKN2A (p14ARF), CDKN2A (p16INK4a), CHEK2, CTNNA1, DICER1, EPCAM (Deletion/duplication testing only), GREM1 (promoter region deletion/duplication testing only), KIT, MEN1, MLH1, MSH2, MSH3, MSH6, MUTYH, NBN, NF1, NHTL1, PALB2, PDGFRA, PMS2, POLD1, POLE, PTEN, RAD50, RAD51C, RAD51D, RNF43, SDHB, SDHC, SDHD, SMAD4, SMARCA4. STK11, TP53, TSC1, TSC2, and VHL.  The following genes were evaluated for sequence changes only: SDHA and HOXB13 c.251G>A variant only.  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  (2) status post right lumpectomy 04/27/2020 for ductal carcinoma in situ, high-grade, with negative margins.  (a) status post bilateral breast reduction 04/27/2020  (3) adjuvant radiation 06/05/2020 through 07/05/2020 Site Technique Total Dose (Gy) Dose per Fx (Gy) Completed Fx Beam Energies  Breast, Right: Breast_Rt 3D 40.05/40.05 2.67 15/15 10X  Breast, Right: Breast_Rt_Bst 3D 10/10 2 5/5 6X, 10X   (4) started tamoxifen 07/24/2020   PLAN:  Ms. Donnald Garre is here for  follow-up on adjuvant tamoxifen.    Breast Cancer On Tamoxifen since 2022 with no reported side effects. Mammogram in August 2024 was normal. Noted oil cyst on physical exam, unchanged from previous. -Continue Tamoxifen as prescribed. -Order mammogram for next year.  Hyperlipidemia Significant weight loss and lifestyle changes have led to discontinuation of cholesterol medication. Triglycerides were 149 in June 2024. -Continue lifestyle modifications and fish oil supplementation.  Weight Management Successful weight loss of 30 pounds with the aid of Zepbound (Tirzepatide). No reported side effects. -Continue Zepbound as prescribed. -Encourage continued lifestyle modifications for weight management.  Hypertension Blood pressure normalized with weight loss. -Monitor blood pressure regularly.  Follow-up in one year or sooner if any changes or concerns arise.  Total time spent: 20 minutes *Total Encounter Time as defined by the Centers for Medicare and Medicaid Services includes, in addition to the face-to-face time of a patient visit (documented in the note above) non-face-to-face time: obtaining and reviewing outside history, ordering and reviewing medications, tests or procedures, care coordination (communications with other health care professionals or caregivers) and documentation in the medical record.

## 2023-06-10 ENCOUNTER — Other Ambulatory Visit: Payer: BC Managed Care – PPO

## 2023-06-10 LAB — LIPID PANEL
Cholesterol: 148 mg/dL (ref 0–200)
HDL: 24.6 mg/dL — ABNORMAL LOW (ref >39.00)
LDL Cholesterol: 80 mg/dL (ref 0–99)
NonHDL: 122.91
Total CHOL/HDL Ratio: 6
Triglycerides: 214 mg/dL — ABNORMAL HIGH (ref 0.0–149.0)
VLDL: 42.8 mg/dL — ABNORMAL HIGH (ref 0.0–40.0)

## 2023-06-10 LAB — COMPREHENSIVE METABOLIC PANEL
ALT: 14 U/L (ref 0–35)
AST: 14 U/L (ref 0–37)
Albumin: 4.3 g/dL (ref 3.5–5.2)
Alkaline Phosphatase: 50 U/L (ref 39–117)
BUN: 15 mg/dL (ref 6–23)
CO2: 24 meq/L (ref 19–32)
Calcium: 8.8 mg/dL (ref 8.4–10.5)
Chloride: 107 meq/L (ref 96–112)
Creatinine, Ser: 0.64 mg/dL (ref 0.40–1.20)
GFR: 102.38 mL/min (ref >60.00)
Glucose, Bld: 96 mg/dL (ref 70–99)
Potassium: 4 meq/L (ref 3.5–5.1)
Sodium: 138 meq/L (ref 135–145)
Total Bilirubin: 0.4 mg/dL (ref 0.2–1.2)
Total Protein: 6.9 g/dL (ref 6.0–8.3)

## 2023-06-10 NOTE — Progress Notes (Signed)
Triglycerides have increased some off the fenofibrate.  She can continue to work on diet/exercise and taking fish oil or can restart the fenofibrate if she wants.

## 2023-07-16 ENCOUNTER — Encounter: Payer: Self-pay | Admitting: Family Medicine

## 2023-07-16 ENCOUNTER — Other Ambulatory Visit: Payer: Self-pay | Admitting: Family

## 2023-07-16 MED ORDER — HYDROXYZINE PAMOATE 25 MG PO CAPS: 25.0000 mg | ORAL_CAPSULE | Freq: Three times a day (TID) | ORAL | 0 refills | Status: AC | PRN

## 2023-08-11 ENCOUNTER — Other Ambulatory Visit: Payer: Self-pay | Admitting: Family Medicine

## 2023-08-11 ENCOUNTER — Encounter: Payer: Self-pay | Admitting: Family Medicine

## 2023-08-11 MED ORDER — ZEPBOUND 5 MG/0.5ML ~~LOC~~ SOAJ: 5.0000 mg | SUBCUTANEOUS | 1 refills | Status: DC

## 2023-08-14 ENCOUNTER — Other Ambulatory Visit: Payer: Self-pay | Admitting: Family Medicine

## 2023-08-14 MED ORDER — ZEPBOUND 5 MG/0.5ML ~~LOC~~ SOAJ: 5.0000 mg | SUBCUTANEOUS | 0 refills | Status: DC

## 2023-08-27 ENCOUNTER — Ambulatory Visit (HOSPITAL_BASED_OUTPATIENT_CLINIC_OR_DEPARTMENT_OTHER): Payer: BC Managed Care – PPO | Admitting: Obstetrics & Gynecology

## 2023-08-27 ENCOUNTER — Encounter (HOSPITAL_BASED_OUTPATIENT_CLINIC_OR_DEPARTMENT_OTHER): Payer: Self-pay

## 2023-08-27 NOTE — Progress Notes (Deleted)
 ANNUAL EXAM Patient name: Catherine Santos MRN [id number]  Date of birth: 1971-06-21 Chief Complaint:   No chief complaint on file.  History of Present Illness:   [name] is a 53 y.o. G43P0011 Caucasian female being seen today for a routine annual exam.  Current complaints: ***  Patient's last menstrual period was 11/17/2011.   The pregnancy intention screening data noted above was reviewed. Potential methods of contraception were discussed. The patient elected to proceed with No data recorded.   Last pap ***. Results were: {Pap findings:25134}. H/O abnormal pap: {yes/yes***/no:23866} Last mammogram: 02/17/2023. Results were: normal. Family h/o breast cancer: yes *** Last colonoscopy: ***. Results were: {normal, abnormal, n/a:23837}. Family h/o colorectal cancer: yes ***     04/10/2023   12:05 PM 09/09/2022   10:48 AM 09/02/2022    3:15 PM 08/22/2022    8:44 AM 04/22/2021   11:27 AM  Depression screen PHQ 2/9  Decreased Interest 0 0 0 0 0  Down, Depressed, Hopeless 0 0 0 0 0  PHQ - 2 Score 0 0 0 0 0  Altered sleeping 1 0     Tired, decreased energy 0 0     Change in appetite 0 0     Feeling bad or failure about yourself  0 0     Trouble concentrating 0 0     Moving slowly or fidgety/restless 0 0     Suicidal thoughts 0 0     PHQ-9 Score 1 0     Difficult doing work/chores Not difficult at all Not difficult at all           04/10/2023   12:05 PM 09/09/2022   10:48 AM  GAD 7 : Generalized Anxiety Score  Nervous, Anxious, on Edge 0 0  Control/stop worrying 0 0  Worry too much - different things 0 0  Trouble relaxing 0 0  Restless 0 0  Easily annoyed or irritable 0 0  Afraid - awful might happen 0 0  Total GAD 7 Score 0 0  Anxiety Difficulty Not difficult at all Not difficult at all     Review of Systems:   Pertinent items are noted in HPI Denies any headaches, blurred vision, fatigue, shortness of breath, chest pain, abdominal pain, abnormal vaginal  discharge/itching/odor/irritation, problems with periods, bowel movements, urination, or intercourse unless otherwise stated above. Pertinent History Reviewed:  Reviewed past medical,surgical, social and family history.  Reviewed problem list, medications and allergies. Physical Assessment:  There were no vitals filed for this visit.There is no height or weight on file to calculate BMI.        Physical Examination:   General appearance - well appearing, and in no distress  Mental status - alert, oriented to person, place, and time  Psych:  She has a normal mood and affect  Skin - warm and dry, normal color, no suspicious lesions noted  Chest - effort normal, all lung fields clear to auscultation bilaterally  Heart - normal rate and regular rhythm  Neck:  midline trachea, no thyromegaly or nodules  Breasts - breasts appear normal, no suspicious masses, no skin or nipple changes or  axillary nodes  Abdomen - soft, nontender, nondistended, no masses or organomegaly  Pelvic - VULVA: normal appearing vulva with no masses, tenderness or lesions  VAGINA: normal appearing vagina with normal color and discharge, no lesions  CERVIX: normal appearing cervix without discharge or lesions, no CMT  Thin prep pap is {Desc; done/not:10129} ***  HR HPV cotesting  UTERUS: uterus is felt to be normal size, shape, consistency and nontender   ADNEXA: No adnexal masses or tenderness noted.  Rectal - normal rectal, good sphincter tone, no masses felt. Hemoccult: ***  Extremities:  No swelling or varicosities noted  Chaperone present for exam  No results found for this or any previous visit (from the past 24 hours).  Assessment & Plan:  1) Well-Woman Exam  2) ***  Labs/procedures today: ***  Mammogram: {Mammo f/u:25212::"@ 53yo"}, or sooner if problems Colonoscopy: {TCS f/u:25213::"@ 53yo"}, or sooner if problems  No orders of the defined types were placed in this encounter.   Meds: No orders of the  defined types were placed in this encounter.   Follow-up: No follow-ups on file.  Catherine Santos, CMA 08/27/2023 11:39 AM

## 2023-09-17 ENCOUNTER — Encounter: Payer: Self-pay | Admitting: Family Medicine

## 2023-09-18 ENCOUNTER — Other Ambulatory Visit: Payer: Self-pay | Admitting: Family Medicine

## 2023-09-18 MED ORDER — FENOFIBRATE 160 MG PO TABS: 160.0000 mg | ORAL_TABLET | Freq: Every day | ORAL | 0 refills | Status: DC

## 2023-10-13 ENCOUNTER — Ambulatory Visit: Payer: BC Managed Care – PPO | Admitting: Family Medicine

## 2023-10-13 ENCOUNTER — Encounter: Payer: Self-pay | Admitting: Family Medicine

## 2023-10-13 VITALS — BP 120/78 | HR 82 | Temp 97.9°F | Ht 65.0 in | Wt 190.6 lb

## 2023-10-13 DIAGNOSIS — E66811 Obesity, class 1: Secondary | ICD-10-CM

## 2023-10-13 DIAGNOSIS — E6609 Other obesity due to excess calories: Secondary | ICD-10-CM | POA: Diagnosis not present

## 2023-10-13 DIAGNOSIS — E782 Mixed hyperlipidemia: Secondary | ICD-10-CM

## 2023-10-13 DIAGNOSIS — Z23 Encounter for immunization: Secondary | ICD-10-CM | POA: Diagnosis not present

## 2023-10-13 DIAGNOSIS — Z6830 Body mass index (BMI) 30.0-30.9, adult: Secondary | ICD-10-CM

## 2023-10-13 LAB — HEMOGLOBIN A1C: Hgb A1c MFr Bld: 5.1 % (ref 4.6–6.5)

## 2023-10-13 LAB — COMPREHENSIVE METABOLIC PANEL WITH GFR
ALT: 15 U/L (ref 0–35)
AST: 18 U/L (ref 0–37)
Albumin: 4.3 g/dL (ref 3.5–5.2)
Alkaline Phosphatase: 43 U/L (ref 39–117)
BUN: 15 mg/dL (ref 6–23)
CO2: 23 meq/L (ref 19–32)
Calcium: 8.8 mg/dL (ref 8.4–10.5)
Chloride: 109 meq/L (ref 96–112)
Creatinine, Ser: 0.71 mg/dL (ref 0.40–1.20)
GFR: 98.26 mL/min (ref >60.00)
Glucose, Bld: 88 mg/dL (ref 70–99)
Potassium: 4.1 meq/L (ref 3.5–5.1)
Sodium: 139 meq/L (ref 135–145)
Total Bilirubin: 0.3 mg/dL (ref 0.2–1.2)
Total Protein: 7 g/dL (ref 6.0–8.3)

## 2023-10-13 LAB — CBC WITH DIFFERENTIAL/PLATELET
Basophils Absolute: 0.1 10*3/uL (ref 0.0–0.1)
Basophils Relative: 1.2 % (ref 0.0–3.0)
Eosinophils Absolute: 0.1 10*3/uL (ref 0.0–0.7)
Eosinophils Relative: 2.4 % (ref 0.0–5.0)
HCT: 38.7 % (ref 36.0–46.0)
Hemoglobin: 13.2 g/dL (ref 12.0–15.0)
Lymphocytes Relative: 38.6 % (ref 12.0–46.0)
Lymphs Abs: 2.2 10*3/uL (ref 0.7–4.0)
MCHC: 34.1 g/dL (ref 30.0–36.0)
MCV: 91.9 fl (ref 78.0–100.0)
Monocytes Absolute: 0.3 10*3/uL (ref 0.1–1.0)
Monocytes Relative: 4.5 % (ref 3.0–12.0)
Neutro Abs: 3.1 10*3/uL (ref 1.4–7.7)
Neutrophils Relative %: 53.3 % (ref 43.0–77.0)
Platelets: 336 10*3/uL (ref 150.0–400.0)
RBC: 4.21 Mil/uL (ref 3.87–5.11)
RDW: 13.5 % (ref 11.5–15.5)
WBC: 5.8 10*3/uL (ref 4.0–10.5)

## 2023-10-13 LAB — LIPID PANEL
Cholesterol: 176 mg/dL (ref 0–200)
HDL: 28.9 mg/dL — ABNORMAL LOW (ref >39.00)
LDL Cholesterol: 99 mg/dL (ref 0–99)
NonHDL: 146.84
Total CHOL/HDL Ratio: 6
Triglycerides: 238 mg/dL — ABNORMAL HIGH (ref 0.0–149.0)
VLDL: 47.6 mg/dL — ABNORMAL HIGH (ref 0.0–40.0)

## 2023-10-13 LAB — TSH: TSH: 2.28 u[IU]/mL (ref 0.35–5.50)

## 2023-10-13 MED ORDER — TIRZEPATIDE-WEIGHT MANAGEMENT 7.5 MG/0.5ML ~~LOC~~ SOLN
7.5000 mg | SUBCUTANEOUS | 1 refills | Status: DC
Start: 2023-10-13 — End: 2023-10-19

## 2023-10-13 MED ORDER — FENOFIBRATE 160 MG PO TABS
160.0000 mg | ORAL_TABLET | Freq: Every day | ORAL | 1 refills | Status: DC
Start: 2023-10-13 — End: 2024-04-18

## 2023-10-13 NOTE — Progress Notes (Signed)
 Labs good except trigs still elevated.  Continue fenofibrate , fish oil, and working on it.  Maybe the 7.5mg  zepbound  will help as well

## 2023-10-13 NOTE — Progress Notes (Signed)
 Subjective:     Patient ID: Catherine Santos, female    DOB: 1971-06-18, 53 y.o.   MRN: [id number]  Chief Complaint  Patient presents with   Follow-up    HPI Discussed the use of AI scribe software for clinical note transcription with the patient, who gave verbal consent to proceed.  History of Present Illness Catherine Santos is a 53 year old female with breast cancer and hyperlipidemia who presents for follow-up on her current treatments and health status.  She is currently undergoing treatment for breast cancer and is taking tamoxifen , with two more years remaining on this medication. No vaginal bleeding associated with tamoxifen  use. She missed her recent gynecologist appointment and is in the process of rescheduling it.  She is managing hyperlipidemia and has resumed taking fenofibrate  after her triglyceride levels increased to the 200s when she stopped the medication. She is also taking fish oil supplements. She has started a new exercise regimen, going to the gym to lift weights three times a week and walking with friends. Additionally, she is using pre-made meals from [employer] to help manage her diet and weight.  She is currently on Zepbound  for weight management, having reduced the dose to 5 mg due to side effects at 10mg  doses. Her weight fluctuates between 185 and 190 pounds. She is concerned about potential insurance changes affecting her medication coverage and is logging her food and exercise using [employer].  She reports a recent tick bite on her left arm, which resulted in a knot but no significant redness or itching. She is monitoring for any symptoms such as fever, chills, or a rash.  She was prescribed hydroxyzine  for anxiety related to a cruise but did not experience a panic attack and has not taken the medication.  No major chest pains, heart racing, headaches, dizziness, shortness of breath, constipation, or diarrhea. Her blood pressure has stabilized, and  she has not experienced the headaches she had previously.    There are no preventive care reminders to display for this patient.   Past Medical History:  Diagnosis Date   Abnormal Pap smear    Cancer (HCC)    breast cancer   Dog bite 04/2017   Family history of breast cancer 03/23/2020   Family history of prostate cancer 03/23/2020   Personal history of radiation therapy     Past Surgical History:  Procedure Laterality Date   ABDOMINAL HYSTERECTOMY     endometriosis   USO   BREAST BIOPSY Right 02/2020   BREAST LUMPECTOMY Right 04/2020   BREAST LUMPECTOMY WITH RADIOACTIVE SEED LOCALIZATION Right 04/27/2020   Procedure: RIGHT BREAST LUMPECTOMY WITH RADIOACTIVE SEED LOCALIZATION;  Surgeon: Caralyn Chandler, MD;  Location: [facility];  Service: General;  Laterality: Right;   BREAST REDUCTION SURGERY Bilateral 04/27/2020   Procedure: ONCOPLASTIC BREAST BILATERAL REDUCTION;  Surgeon: Thornell Flirt, DO;  Location: [facility];  Service: Plastics;  Laterality: Bilateral;   CYSTOSCOPY  12/30/2011   Procedure: CYSTOSCOPY;  Surgeon: Axel Bohr, MD;  Location: [facility];  Service: Gynecology;  Laterality: N/A;   REDUCTION MAMMAPLASTY Left 04/2020   ROBOTIC ASSISTED LAP VAGINAL HYSTERECTOMY     SALPINGOOPHORECTOMY  12/30/2011   Procedure: SALPINGO OOPHERECTOMY;  Surgeon: Axel Bohr, MD;  Location: [facility];  Service: Gynecology;  Laterality: Left;   TUBAL LIGATION       Current Outpatient Medications:    Cholecalciferol (D3-1000) 25 MCG (1000 UT) tablet,  Take 1,000 Units by mouth daily. 2 tablets daily, Disp: , Rfl:    hydrOXYzine  (VISTARIL ) 25 MG capsule, Take 1 capsule (25 mg total) by mouth every 8 (eight) hours as needed., Disp: 30 capsule, Rfl: 0   Multiple Vitamin (MULTIVITAMIN PO), Take by mouth., Disp: , Rfl:    Omega-3 Fatty Acids (FISH OIL) 300 MG CAPS, Take 2,000 mg by mouth in the morning and at bedtime., Disp: , Rfl:     tamoxifen  (NOLVADEX ) 20 MG tablet, Take 1 tablet (20 mg total) by mouth daily., Disp: 90 tablet, Rfl: 3   tirzepatide  7.5 MG/0.5ML injection vial, Inject 7.5 mg into the skin once a week., Disp: 6 mL, Rfl: 1   valACYclovir  (VALTREX ) 1000 MG tablet, With symptom onset, take 2 tablets (two grams) and repeat in 12 hours., Disp: 30 tablet, Rfl: 4   fenofibrate  160 MG tablet, Take 1 tablet (160 mg total) by mouth daily., Disp: 90 tablet, Rfl: 1  Allergies  Allergen Reactions   Phentermine Palpitations   Penicillins Other (See Comments)    Pt doesn't remember reaction to PCN- was in childhood   Sulfa Antibiotics Rash   ROS neg/noncontributory except as noted HPI/below      Objective:     BP 120/78   Pulse 82   Temp 97.9 F (36.6 C)   Ht 5\' 5"  (1.651 m)   Wt 190 lb 9.6 oz (86.5 kg)   LMP 11/17/2011   SpO2 97%   BMI 31.72 kg/m  Wt Readings from Last 3 Encounters:  10/13/23 190 lb 9.6 oz (86.5 kg)  04/23/23 189 lb 9.6 oz (86 kg)  04/10/23 185 lb 9.6 oz (84.2 kg)    Physical Exam   Gen: WDWN NAD HEENT: NCAT, conjunctiva not injected, sclera nonicteric NECK:  supple, no thyromegaly, no nodes, no carotid bruits CARDIAC: RRR, S1S2+, no murmur. DP 2+B LUNGS: CTAB. No wheezes ABDOMEN:  BS+, soft, NTND, No HSM, no masses EXT:  no edema MSK: no gross abnormalities.  NEURO: A&O x3.  CN II-XII intact.  PSYCH: normal mood. Good eye contact Tiny bump L upper arm-no redness, pus, rash     Assessment & Plan:  Hyperlipidemia, mixed -     Fenofibrate ; Take 1 tablet (160 mg total) by mouth daily.  Dispense: 90 tablet; Refill: 1 -     Tirzepatide -Weight Management; Inject 7.5 mg into the skin once a week.  Dispense: 6 mL; Refill: 1 -     CBC with Differential/Platelet -     Comprehensive metabolic panel with GFR -     Lipid panel -     TSH -     Hemoglobin A1c  Class 1 obesity due to excess calories with serious comorbidity and body mass index (BMI) of 30.0 to 30.9 in adult -      Tirzepatide -Weight Management; Inject 7.5 mg into the skin once a week.  Dispense: 6 mL; Refill: 1  Need for vaccination -     Tdap vaccine greater than or equal to 7yo IM  Assessment and Plan Assessment & Plan Breast cancer, current treatment with tamoxifen    Breast cancer remains well-controlled with tamoxifen , with no vaginal bleeding reported. She has two years remaining on tamoxifen  therapy.  Obesity   Obesity management involves lifestyle modifications, including diet and exercise. Her weight fluctuates between 185-190 lbs. There are concerns about insurance coverage for Zepbound  due to BMI requirements. She is currently on a 5 mg dose, having previously experienced adverse effects on 10  mg. An increase to 7.5 mg is under consideration to potentially meet insurance requirements. She engages in regular exercise and dietary management with pre-made meals and logs food intake. Insurance may require a higher dose or additional documentation for continued coverage. Send a prescription for Zepbound  7.5 mg to [facility]. Continue exercise regimen and dietary management. Log food intake and exercise using [employer]. Consider alternating dosing schedule to extend medication supply if insurance coverage changes.  Hypertriglyceridemia   Hypertriglyceridemia is managed with fenofibrate  and fish oil. Previous triglyceride levels were in the 500s, now reduced to the 200s. She resumed fenofibrate  after levels increased when off medication. Renew fenofibrate  prescription and send to [facility]. Order blood work to check triglyceride levels.  Tick bite, monitoring for complications   A recent tick bite on her left arm presents with a knot but no significant redness or itching. Monitoring for potential complications such as fever, chills, body aches, or erythema migrans is ongoing. Administer a tetanus vaccine in the right arm. Monitor for symptoms such as fever, chills, body aches, or target  rash.  Wellness Visit   During the routine wellness visit, no new surgeries were reported since the last visit. She missed a gynecologist appointment, which needs rescheduling, and cervical cancer screening is postponed for a couple of months. No major symptoms such as chest pain, palpitations, headache, dizziness, or dyspnea were reported. No constipation or diarrhea. Blood pressure and headaches have improved. Reschedule the gynecologist appointment and postpone cervical cancer screening for a couple of months.    Return in about 6 months (around 04/13/2024) for chronic follow-up.  Ellsworth Haas, MD

## 2023-10-13 NOTE — Patient Instructions (Signed)
 It was very nice to see you today!  Increase zepbound  to 7.5mg    PLEASE NOTE:  If you had any lab tests please let us  know if you have not heard back within a few days. You may see your results on MyChart before we have a chance to review them but we will give you a call once they are reviewed by us . If we ordered any referrals today, please let us  know if you have not heard from their office within the next week.   Please try these tips to maintain a healthy lifestyle:  Eat most of your calories during the day when you are active. Eliminate processed foods including packaged sweets (pies, cakes, cookies), reduce intake of potatoes, white bread, white pasta, and white rice. Look for whole grain options, oat flour or almond flour.  Each meal should contain half fruits/vegetables, one quarter protein, and one quarter carbs (no bigger than a computer mouse).  Cut down on sweet beverages. This includes juice, soda, and sweet tea. Also watch fruit intake, though this is a healthier sweet option, it still contains natural sugar! Limit to 3 servings daily.  Drink at least 1 glass of water  with each meal and aim for at least 8 glasses per day  Exercise at least 150 minutes every week.

## 2023-10-14 ENCOUNTER — Encounter (HOSPITAL_BASED_OUTPATIENT_CLINIC_OR_DEPARTMENT_OTHER): Payer: Self-pay | Admitting: Obstetrics & Gynecology

## 2023-10-14 ENCOUNTER — Ambulatory Visit (HOSPITAL_BASED_OUTPATIENT_CLINIC_OR_DEPARTMENT_OTHER): Admitting: Obstetrics & Gynecology

## 2023-10-14 VITALS — BP 123/79 | HR 88 | Ht 65.0 in | Wt 185.0 lb

## 2023-10-14 DIAGNOSIS — Z90710 Acquired absence of both cervix and uterus: Secondary | ICD-10-CM | POA: Diagnosis not present

## 2023-10-14 DIAGNOSIS — Z8619 Personal history of other infectious and parasitic diseases: Secondary | ICD-10-CM | POA: Diagnosis not present

## 2023-10-14 DIAGNOSIS — Z01419 Encounter for gynecological examination (general) (routine) without abnormal findings: Secondary | ICD-10-CM

## 2023-10-14 DIAGNOSIS — Z853 Personal history of malignant neoplasm of breast: Secondary | ICD-10-CM | POA: Diagnosis not present

## 2023-10-14 MED ORDER — VALACYCLOVIR HCL 1 G PO TABS: ORAL_TABLET | ORAL | 4 refills | Status: AC

## 2023-10-14 NOTE — Progress Notes (Signed)
 ANNUAL EXAM Patient name: Catherine Santos MRN [id number]  Date of birth: 1970-09-24 Chief Complaint:   Annual Exam (/)  History of Present Illness:   Catherine Santos is a 53 y.o. G24P0011 Caucasian female being seen today for a routine annual exam.  Doing well.  Son and daughter in law are doing well.  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  Was dating someone but he got cold feet.    H/o breast cancer.  On tamoxifen .  Will be on this for 5 years total.    Has lost weight since I saw her last.    Denies vaginal bleeding.     Patient's last menstrual period was 11/17/2011.  Last pap . Prior to hysterectomy Last mammogram: 02/17/2023. Results were: normal. Family h/o breast cancer: yes :  First cousin Last colonoscopy: declined but doing cologuard 03/02/2023.        10/14/2023    1:16 PM 10/13/2023    9:18 AM 04/10/2023   12:05 PM 09/09/2022   10:48 AM 09/02/2022    3:15 PM  Depression screen PHQ 2/9  Decreased Interest 0 0 0 0 0  Down, Depressed, Hopeless 0 0 0 0 0  PHQ - 2 Score 0 0 0 0 0  Altered sleeping   1 0   Tired, decreased energy   0 0   Change in appetite   0 0   Feeling bad or failure about yourself    0 0   Trouble concentrating   0 0   Moving slowly or fidgety/restless   0 0   Suicidal thoughts   0 0   PHQ-9 Score   1 0   Difficult doing work/chores   Not difficult at all Not difficult at all         04/10/2023   12:05 PM 09/09/2022   10:48 AM  GAD 7 : Generalized Anxiety Score  Nervous, Anxious, on Edge 0 0  Control/stop worrying 0 0  Worry too much - different things 0 0  Trouble relaxing 0 0  Restless 0 0  Easily annoyed or irritable 0 0  Afraid - awful might happen 0 0  Total GAD 7 Score 0 0  Anxiety Difficulty Not difficult at all Not difficult at all     Review of Systems:   Pertinent items are noted in HPI  Denies any vaginal bleeding or urinary issues.  Denies GI issues.   Pertinent History Reviewed:  Reviewed past  medical,surgical, social and family history.  Reviewed problem list, medications and allergies. Physical Assessment:   Vitals:   10/14/23 1312  BP: 123/79  Pulse: 88  Weight: 185 lb (83.9 kg)  Height: 5\' 5"  (1.651 m)  Body mass index is 30.79 kg/m.        Physical Examination:   General appearance - well appearing, and in no distress  Mental status - alert, oriented to person, place, and time  Psych:  She has a normal mood and affect  Skin - warm and dry, normal color, no suspicious lesions noted  Chest - effort normal, all lung fields clear to auscultation bilaterally  Heart - normal rate and regular rhythm  Neck:  midline trachea, no thyromegaly or nodules  Breasts - breasts appear normal, no suspicious masses, no skin or nipple changes or  axillary nodes  Abdomen - soft, nontender, nondistended, no masses or organomegaly  Pelvic - VULVA: normal appearing vulva with no masses, tenderness or lesions  VAGINA: normal appearing vagina with normal color and discharge, no lesions    CERVIX: surgically absent  Thin prep pap not obtained  UTERUS: surgically absent  ADNEXA: No adnexal masses or tenderness noted.  Rectal - normal rectal, good sphincter tone, no masses felt.  Extremities:  No swelling or varicosities noted  Chaperone present for exam  Assessment & Plan:  1. Well woman exam with routine gynecological exam (Primary) - Pap smear not indicated - Mammogram 01/2023 - Colonoscopy declined.  Cologuard negative 2024.   - Bone mineral density not indicated yet - lab work done with PCP, Dr. Waldo Guitar - vaccines reviewed/updated  2. History of breast cancer - followed by Dr. Arno Bibles - on tamoxifen   3. History of cold sores - RF for valtrex  to pharmacy  4. H/O: hysterectomy   No orders of the defined types were placed in this encounter.   Meds:  Meds ordered this encounter  Medications   valACYclovir  (VALTREX ) 1000 MG tablet    Sig: With symptom onset, take 2 tablets  (two grams) and repeat in 12 hours.    Dispense:  30 tablet    Refill:  4    Follow-up: No follow-ups on file.  Lillian Rein, MD 10/14/2023 1:53 PM

## 2023-10-19 ENCOUNTER — Other Ambulatory Visit: Payer: Self-pay | Admitting: Family Medicine

## 2023-10-19 MED ORDER — ZEPBOUND 7.5 MG/0.5ML ~~LOC~~ SOAJ: 7.5000 mg | SUBCUTANEOUS | 1 refills | Status: DC

## 2023-10-19 NOTE — Progress Notes (Signed)
 Error, sent vials

## 2023-10-22 ENCOUNTER — Other Ambulatory Visit (HOSPITAL_COMMUNITY): Payer: Self-pay

## 2023-10-22 ENCOUNTER — Encounter: Payer: Self-pay | Admitting: *Deleted

## 2023-10-22 ENCOUNTER — Telehealth: Payer: Self-pay

## 2023-10-22 NOTE — Telephone Encounter (Signed)
 Additional information has been requested from the patient's insurance in order to proceed with the prior authorization request.   Placed a call to [facility] about the fax and per the rep, there is a PA on file that is good until 12/19/23 for the Zepbound  pens and she tried to submit a renewal PA to start 12/20/23 and it stated no PA is needed. She also did a test claim dated for July 2025 and received a paid claim.   Phone#  [phone]  Letter indexed to media tab

## 2023-10-22 NOTE — Telephone Encounter (Signed)
 Patient notified of message below.

## 2023-10-22 NOTE — Telephone Encounter (Signed)
 FYI

## 2023-11-20 ENCOUNTER — Telehealth: Payer: Self-pay

## 2023-11-20 ENCOUNTER — Other Ambulatory Visit (HOSPITAL_COMMUNITY): Payer: Self-pay

## 2023-11-20 NOTE — Telephone Encounter (Signed)
 Noted

## 2023-11-20 NOTE — Telephone Encounter (Signed)
 Pharmacy Patient Advocate Encounter   Received notification from CoverMyMeds that prior authorization for Zepbound  10 is required/requested.   Insurance verification completed.   The patient is insured through [employer] .   Per test claim: Refill too soon. PA is not needed at this time. Medication was filled 10/19/23. Next eligible fill date is 12/20/23. Current prior authorization expires 12/19/23. Will need to do prior authorization at that time. CMM still shows pa not needed.

## 2023-11-30 ENCOUNTER — Encounter: Payer: Self-pay | Admitting: Family Medicine

## 2023-12-01 ENCOUNTER — Other Ambulatory Visit: Payer: Self-pay | Admitting: Family Medicine

## 2023-12-01 ENCOUNTER — Ambulatory Visit (HOSPITAL_BASED_OUTPATIENT_CLINIC_OR_DEPARTMENT_OTHER): Admitting: Obstetrics & Gynecology

## 2023-12-01 MED ORDER — ZEPBOUND 10 MG/0.5ML ~~LOC~~ SOAJ: 10.0000 mg | SUBCUTANEOUS | 0 refills | Status: DC

## 2024-02-18 ENCOUNTER — Ambulatory Visit
Admission: RE | Admit: 2024-02-18 | Discharge: 2024-02-18 | Disposition: A | Discharge status: Home / Self Care | Source: Ambulatory Visit | Attending: Hematology and Oncology

## 2024-02-18 DIAGNOSIS — Z1231 Encounter for screening mammogram for malignant neoplasm of breast: Secondary | ICD-10-CM | POA: Diagnosis not present

## 2024-02-18 DIAGNOSIS — D0511 Intraductal carcinoma in situ of right breast: Secondary | ICD-10-CM

## 2024-03-02 ENCOUNTER — Other Ambulatory Visit: Payer: Self-pay | Admitting: Family Medicine

## 2024-03-02 ENCOUNTER — Encounter: Payer: Self-pay | Admitting: Family Medicine

## 2024-03-02 MED ORDER — ZEPBOUND 10 MG/0.5ML ~~LOC~~ SOAJ: 10.0000 mg | SUBCUTANEOUS | 0 refills | Status: DC

## 2024-03-02 MED ORDER — ZEPBOUND 12.5 MG/0.5ML ~~LOC~~ SOAJ: 12.5000 mg | SUBCUTANEOUS | 0 refills | Status: DC

## 2024-03-11 ENCOUNTER — Encounter: Payer: Self-pay | Admitting: *Deleted

## 2024-03-11 ENCOUNTER — Telehealth: Payer: Self-pay

## 2024-03-11 ENCOUNTER — Other Ambulatory Visit (HOSPITAL_COMMUNITY): Payer: Self-pay

## 2024-03-11 NOTE — Telephone Encounter (Signed)
 Patient notified of message below.

## 2024-03-11 NOTE — Telephone Encounter (Signed)
 Pharmacy Patient Advocate Encounter  Received notification from [facility] that Prior Authorization for ZEPBOUND  has been APPROVED from 02/08/24 to 03/09/25   PA #/Case ID/Reference #: [id number]

## 2024-04-13 ENCOUNTER — Ambulatory Visit: Admitting: Family Medicine

## 2024-04-18 ENCOUNTER — Ambulatory Visit: Admitting: Family Medicine

## 2024-04-18 ENCOUNTER — Ambulatory Visit: Payer: Self-pay | Admitting: Family Medicine

## 2024-04-18 ENCOUNTER — Encounter: Payer: Self-pay | Admitting: Family Medicine

## 2024-04-18 VITALS — BP 130/84 | HR 86 | Temp 98.1°F | Ht 65.0 in | Wt 189.0 lb

## 2024-04-18 DIAGNOSIS — E782 Mixed hyperlipidemia: Secondary | ICD-10-CM

## 2024-04-18 DIAGNOSIS — R0789 Other chest pain: Secondary | ICD-10-CM | POA: Diagnosis not present

## 2024-04-18 DIAGNOSIS — E66811 Obesity, class 1: Secondary | ICD-10-CM | POA: Diagnosis not present

## 2024-04-18 DIAGNOSIS — R945 Abnormal results of liver function studies: Secondary | ICD-10-CM

## 2024-04-18 DIAGNOSIS — Z6830 Body mass index (BMI) 30.0-30.9, adult: Secondary | ICD-10-CM

## 2024-04-18 DIAGNOSIS — E6609 Other obesity due to excess calories: Secondary | ICD-10-CM

## 2024-04-18 DIAGNOSIS — Z23 Encounter for immunization: Secondary | ICD-10-CM

## 2024-04-18 LAB — LIPID PANEL
Cholesterol: 225 mg/dL — ABNORMAL HIGH (ref 0–200)
HDL: 27.5 mg/dL — ABNORMAL LOW (ref >39.00)
LDL Cholesterol: 129 mg/dL — ABNORMAL HIGH (ref 0–99)
NonHDL: 197.09
Total CHOL/HDL Ratio: 8
Triglycerides: 340 mg/dL — ABNORMAL HIGH (ref 0.0–149.0)
VLDL: 68 mg/dL — ABNORMAL HIGH (ref 0.0–40.0)

## 2024-04-18 LAB — COMPREHENSIVE METABOLIC PANEL WITH GFR
ALT: 18 U/L (ref 0–35)
AST: 22 U/L (ref 0–37)
Albumin: 4.9 g/dL (ref 3.5–5.2)
Alkaline Phosphatase: 43 U/L (ref 39–117)
BUN: 16 mg/dL (ref 6–23)
CO2: 24 meq/L (ref 19–32)
Calcium: 9.7 mg/dL (ref 8.4–10.5)
Chloride: 103 meq/L (ref 96–112)
Creatinine, Ser: 0.85 mg/dL (ref 0.40–1.20)
GFR: 78.89 mL/min (ref >60.00)
Glucose, Bld: 88 mg/dL (ref 70–99)
Potassium: 4.3 meq/L (ref 3.5–5.1)
Sodium: 137 meq/L (ref 135–145)
Total Bilirubin: 0.4 mg/dL (ref 0.2–1.2)
Total Protein: 7.7 g/dL (ref 6.0–8.3)

## 2024-04-18 MED ORDER — FENOFIBRATE 160 MG PO TABS: 160.0000 mg | ORAL_TABLET | Freq: Every day | ORAL | 1 refills | Status: DC

## 2024-04-18 MED ORDER — ZEPBOUND 15 MG/0.5ML ~~LOC~~ SOAJ
15.0000 mg | SUBCUTANEOUS | 1 refills | Status: DC
Start: 2024-04-18 — End: 2024-04-27

## 2024-04-18 NOTE — Progress Notes (Signed)
 Subjective:     Patient ID: Catherine Santos, female    DOB: Jun 05, 1971, 53 y.o.   MRN: [id number]  Chief Complaint  Patient presents with   Hyperlipidemia    67mo follow up; want to discuss Zepbound ;  OB scheduled for March    Discussed the use of AI scribe software for clinical note transcription with the patient, who gave verbal consent to proceed.  History of Present Illness Catherine Santos is a 53 year old female with hyperlipidemia who presents for follow-up on her hyperlipidemia and weight management.  She is currently taking fenofibrate  160 mg daily for cholesterol management. Her triglycerides were initially measured at 500 during a work health screening, but a subsequent test showed a reduction to 200. She has been off track with her diet over the summer but is now focusing on diet and exercise, inspired by her sister, a bodybuilder. She has a family history of cardiovascular disease, including an uncle who died of a massive heart attack at 19.  She is on Zepbound  12.5 mg for weight management but is concerned about insurance coverage ending at the end of the year. She is considering adjusting her dosage .Catherine Santos She has been maintaining her weight and is motivated to improve her diet and exercise routine.  She has a history of breast cancer and is currently on tamoxifen , which she will continue for another year and some months.  She experiences occasional chest pains at night, which she attributes to indigestion. These pains do not radiate, do not cause shortness of breath, and do not occur during exercise. Her mother was recently hospitalized for low calcium levels, which she is mindful of in the context of her own health.    Health Maintenance Due  Topic Date Due   Cervical Cancer Screening (HPV/Pap Cotest)  Never done   Pneumococcal Vaccine: 50+ Years (1 of 1 - PCV) Never done   COVID-19 Vaccine (4 - 2025-26 season) 02/22/2024    Past Medical History:  Diagnosis Date    Abnormal Pap smear    Cancer (HCC)    breast cancer   Dog bite 04/2017   Family history of breast cancer 03/23/2020   Family history of prostate cancer 03/23/2020   Personal history of radiation therapy     Past Surgical History:  Procedure Laterality Date   ABDOMINAL HYSTERECTOMY     endometriosis   USO   BREAST BIOPSY Right 02/2020   BREAST LUMPECTOMY Right 04/2020   BREAST LUMPECTOMY WITH RADIOACTIVE SEED LOCALIZATION Right 04/27/2020   Procedure: RIGHT BREAST LUMPECTOMY WITH RADIOACTIVE SEED LOCALIZATION;  Surgeon: Curvin Deward MOULD, MD;  Location: [facility];  Service: General;  Laterality: Right;   BREAST REDUCTION SURGERY Bilateral 04/27/2020   Procedure: ONCOPLASTIC BREAST BILATERAL REDUCTION;  Surgeon: Lowery Estefana RAMAN, DO;  Location: [facility];  Service: Plastics;  Laterality: Bilateral;   CYSTOSCOPY  12/30/2011   Procedure: CYSTOSCOPY;  Surgeon: Ronal Elvie Pinal, MD;  Location: [facility];  Service: Gynecology;  Laterality: N/A;   REDUCTION MAMMAPLASTY Left 04/2020   ROBOTIC ASSISTED LAP VAGINAL HYSTERECTOMY     SALPINGOOPHORECTOMY  12/30/2011   Procedure: SALPINGO OOPHERECTOMY;  Surgeon: Ronal Elvie Pinal, MD;  Location: [facility];  Service: Gynecology;  Laterality: Left;   TUBAL LIGATION       Current Outpatient Medications:    Cholecalciferol (D3-1000) 25 MCG (1000 UT) tablet, Take 1,000 Units by mouth daily. 2 tablets daily, Disp: , Rfl:    hydrOXYzine  (  VISTARIL ) 25 MG capsule, Take 1 capsule (25 mg total) by mouth every 8 (eight) hours as needed., Disp: 30 capsule, Rfl: 0   Multiple Vitamin (MULTIVITAMIN PO), Take by mouth., Disp: , Rfl:    Omega-3 Fatty Acids (FISH OIL) 300 MG CAPS, Take 2,000 mg by mouth in the morning and at bedtime., Disp: , Rfl:    tamoxifen  (NOLVADEX ) 20 MG tablet, Take 1 tablet (20 mg total) by mouth daily., Disp: 90 tablet, Rfl: 3   tirzepatide  (ZEPBOUND ) 15 MG/0.5ML Pen, Inject 15 mg into the skin once a week.,  Disp: 6 mL, Rfl: 1   valACYclovir  (VALTREX ) 1000 MG tablet, With symptom onset, take 2 tablets (two grams) and repeat in 12 hours., Disp: 30 tablet, Rfl: 4   fenofibrate  160 MG tablet, Take 1 tablet (160 mg total) by mouth daily., Disp: 90 tablet, Rfl: 1  Allergies  Allergen Reactions   Phentermine Palpitations   Penicillins Other (See Comments)    Pt doesn't remember reaction to PCN- was in childhood   Sulfa Antibiotics Rash   ROS neg/noncontributory except as noted HPI/below      Objective:     BP 130/84   Pulse 86   Temp 98.1 F (36.7 C)   Ht 5' 5 (1.651 m)   Wt 189 lb (85.7 kg)   LMP 11/17/2011   SpO2 98%   BMI 31.45 kg/m  Wt Readings from Last 3 Encounters:  04/18/24 189 lb (85.7 kg)  10/14/23 185 lb (83.9 kg)  10/13/23 190 lb 9.6 oz (86.5 kg)    Physical Exam GENERAL: Well developed well nourished no acute distress HEAD EYES EARS NOSE THROAT: Normocephalic atraumatic, conjunctiva not injected, sclera nonicteric CARDIAC: Regular rate and rhythm, S1 S2 present , no murmur, dorsalis pedis 2 plus bilaterally NECK: Supple, no thyromegaly, no nodes, no carotid bruits LUNGS: Clear to auscultation bilaterally, no wheezes ABDOMEN: Bowel sounds present, soft, non tender non distended, no hepatosplenomegaly, no masses EXTREMITIES: No edema MUSCULOSKELETAL: No gross abnormalities NEUROLOGICAL: Alert and oriented x3, cranial nerves II through XII intact PSYCHIATRIC: Normal mood, good eye contact       Assessment & Plan:  Hyperlipidemia, mixed -     CT CARDIAC SCORING (SELF PAY ONLY); Future -     Fenofibrate ; Take 1 tablet (160 mg total) by mouth daily.  Dispense: 90 tablet; Refill: 1 -     Lipid panel -     Comprehensive metabolic panel with GFR  Class 1 obesity due to excess calories with serious comorbidity and body mass index (BMI) of 30.0 to 30.9 in adult -     Zepbound ; Inject 15 mg into the skin once a week.  Dispense: 6 mL; Refill: 1  Atypical chest  pain  Encounter for immunization -     Flu vaccine trivalent PF, 6mos and older(Flulaval,Afluria,Fluarix,Fluzone)    Assessment and Plan Assessment & Plan Non-cardiac chest pain   Intermittent chest pain, likely due to indigestion or reflux, occurs mainly at night without dyspnea or exertional symptoms. Potential causes include reflux and positional factors. Start a trial of omeprazole or similar medication for 6-8 weeks to assess improvement. Order a calcium score CT scan for reassurance given her family history of heart disease.discussed limitation of test.   Mixed hyperlipidemia   Manage with fenofibrate  160 mg daily. Triglycerides recently measured in the 200s, previously reported as 500, indicating variability. Discussed the importance of cholesterol management due to family history of heart disease. Continue fenofibrate  160 mg daily and  order a calcium score CT scan to assess for coronary artery calcium deposits.  Obesity   Ongoing management with Zepbound  12.5 mg. Insurance coverage for Zepbound  will end at the end of the year. Discussed strategies to extend medication supply, I  for now, increase the dose to 15 mg and adjusting dosing frequency-has been on plateau.. Explored alternative options, including potential future oral medications and cost considerations. Increase Zepbound  to 15 mg and order a three-month supply. Consider extending the dosing interval to every 10-14 days to prolong supply. Explore alternative weight management options, including future oral medications.  Breast cancer on tamoxifen  therapy   Continue tamoxifen  therapy with approximately one year remaining on treatment.     Return in about 6 months (around 10/17/2024) for annual physical.  Jenkins CHRISTELLA Carrel, MD

## 2024-04-18 NOTE — Progress Notes (Signed)
 Triglycerides are elevated.  Is she willing to add lipitor 10mg  daily to see if helps?  If so, reck lipids and lft's in 3 months

## 2024-04-18 NOTE — Patient Instructions (Signed)
 It was very nice to see you today!  [facility] on [address]   PLEASE NOTE:  If you had any lab tests please let us  know if you have not heard back within a few days. You may see your results on MyChart before we have a chance to review them but we will give you a call once they are reviewed by us . If we ordered any referrals today, please let us  know if you have not heard from their office within the next week.   Please try these tips to maintain a healthy lifestyle:  Eat most of your calories during the day when you are active. Eliminate processed foods including packaged sweets (pies, cakes, cookies), reduce intake of potatoes, white bread, white pasta, and white rice. Look for whole grain options, oat flour or almond flour.  Each meal should contain half fruits/vegetables, one quarter protein, and one quarter carbs (no bigger than a computer mouse).  Cut down on sweet beverages. This includes juice, soda, and sweet tea. Also watch fruit intake, though this is a healthier sweet option, it still contains natural sugar! Limit to 3 servings daily.  Drink at least 1 glass of water  with each meal and aim for at least 8 glasses per day  Exercise at least 150 minutes every week.

## 2024-04-19 MED ORDER — ATORVASTATIN CALCIUM 10 MG PO TABS
10.0000 mg | ORAL_TABLET | Freq: Every day | ORAL | 1 refills | Status: AC
Start: 2024-04-19 — End: ?

## 2024-04-19 NOTE — Telephone Encounter (Signed)
 Sent in Lipitor and ordered repeat labs for 52mo out per Dr Wendolyn. Informed pt via Mychart.

## 2024-04-25 ENCOUNTER — Ambulatory Visit: Payer: BC Managed Care – PPO | Admitting: Hematology and Oncology

## 2024-04-26 ENCOUNTER — Telehealth: Payer: Self-pay

## 2024-04-26 NOTE — Telephone Encounter (Signed)
 Left message on voicemail for patient about upcoming appointment

## 2024-04-27 ENCOUNTER — Other Ambulatory Visit: Payer: Self-pay

## 2024-04-27 ENCOUNTER — Inpatient Hospital Stay: Attending: Hematology and Oncology | Admitting: Hematology and Oncology

## 2024-04-27 VITALS — BP 112/74 | HR 88 | Temp 98.2°F | Resp 17 | Wt 190.8 lb

## 2024-04-27 DIAGNOSIS — D0511 Intraductal carcinoma in situ of right breast: Secondary | ICD-10-CM

## 2024-04-27 DIAGNOSIS — Z801 Family history of malignant neoplasm of trachea, bronchus and lung: Secondary | ICD-10-CM | POA: Diagnosis not present

## 2024-04-27 DIAGNOSIS — E66811 Other obesity due to excess calories: Secondary | ICD-10-CM

## 2024-04-27 DIAGNOSIS — Z79899 Other long term (current) drug therapy: Secondary | ICD-10-CM | POA: Diagnosis not present

## 2024-04-27 DIAGNOSIS — Z803 Family history of malignant neoplasm of breast: Secondary | ICD-10-CM | POA: Diagnosis not present

## 2024-04-27 DIAGNOSIS — Z923 Personal history of irradiation: Secondary | ICD-10-CM | POA: Diagnosis not present

## 2024-04-27 DIAGNOSIS — Z87891 Personal history of nicotine dependence: Secondary | ICD-10-CM | POA: Diagnosis not present

## 2024-04-27 DIAGNOSIS — Z79810 Long term (current) use of selective estrogen receptor modulators (SERMs): Secondary | ICD-10-CM | POA: Diagnosis not present

## 2024-04-27 DIAGNOSIS — Z800 Family history of malignant neoplasm of digestive organs: Secondary | ICD-10-CM | POA: Diagnosis not present

## 2024-04-27 MED ORDER — ZEPBOUND 15 MG/0.5ML ~~LOC~~ SOAJ: 15.0000 mg | SUBCUTANEOUS | Status: DC

## 2024-04-27 NOTE — Telephone Encounter (Signed)
 Sent in [place]

## 2024-04-27 NOTE — Progress Notes (Signed)
 [facility]  Telephone:(336) [phone] Fax:(336) [phone]     ID: Catherine Santos DOB: 1970/06/28  MR#: [id number]  RDW#:[id number]  Patient Care Team: Wendolyn Jenkins Jansky, MD as PCP - General (Family Medicine) Tyree Nanetta SAILOR, RN as Oncology Nurse Navigator Curvin Deward MOULD, MD as Consulting Physician (General Surgery) Izell Domino, MD as Attending Physician (Radiation Oncology) Cleotilde Ronal RAMAN, MD as Consulting Physician (Gynecology) Dillingham, Estefana RAMAN, DO as Attending Physician (Plastic Surgery) Amber Stalls, MD  CHIEF COMPLAINT: noninvasive breast cancer, estrogen receptor positive  CURRENT TREATMENT: Tamoxifen   INTERVAL HISTORY:  Discussed the use of AI scribe software for clinical note transcription with the patient, who gave verbal consent to proceed.  History of Present Illness    Discussed the use of AI scribe software for clinical note transcription with the patient, who gave verbal consent to proceed.  History of Present Illness Catherine Santos is a 53 year old female with breast cancer who presents for a follow-up visit.  She has been on tamoxifen  since February 2022 and plans to continue until 2027. A recent mammogram on February 23, 2024, was normal. Her liver panel, monitored due to tamoxifen  use, is also normal.  She is currently taking Zepbound  for weight loss and has lost about 30 pounds from an initial weight of 214 pounds. Her weight loss has plateaued, and she has not yet started the 15 mg dose of Zepbound , which was recently prescribed. She has enough medication at home to maintain her current regimen.  Her blood pressure has normalized with the weight loss, but her cholesterol levels remain variable. She was recently prescribed another medication to manage her cholesterol.  She is attempting to maintain an exercise routine, including walking and potentially weightlifting.   REVIEW OF SYSTEMS:   COVID 19 VACCINATION STATUS: s/p Pfizer x 3 as  of October 2022   HISTORY OF CURRENT ILLNESS: From Dr. Tauna original intake note:   Catherine Santos 53 y.o. female is here because of recent diagnosis of right breast ductal carcinoma in situ. Screening mammogram on 02/08/20 detected right breast calcifications. Diagnostic mammogram on 02/23/20 showed a 0.9cm group of right breast calcifications. Biopsy on 03/07/20 showed ductal carcinoma in situ, grade 2-3, ER+ 80%, PR+ 50%.  She underwent right lumpectomy on 04/27/2020 under Dr. Curvin. Pathology from the procedure [id number]) showed: ductal carcinoma in situ, high grade, with necrosis; margins negative.  The patient's subsequent history is as detailed below.   PAST MEDICAL HISTORY: Past Medical History:  Diagnosis Date   Abnormal Pap smear    Cancer (HCC)    breast cancer   Dog bite 04/2017   Family history of breast cancer 03/23/2020   Family history of prostate cancer 03/23/2020   Personal history of radiation therapy     PAST SURGICAL HISTORY: Past Surgical History:  Procedure Laterality Date   ABDOMINAL HYSTERECTOMY     endometriosis   USO   BREAST BIOPSY Right 02/2020   BREAST LUMPECTOMY Right 04/2020   BREAST LUMPECTOMY WITH RADIOACTIVE SEED LOCALIZATION Right 04/27/2020   Procedure: RIGHT BREAST LUMPECTOMY WITH RADIOACTIVE SEED LOCALIZATION;  Surgeon: Curvin Deward MOULD, MD;  Location: [facility];  Service: General;  Laterality: Right;   BREAST REDUCTION SURGERY Bilateral 04/27/2020   Procedure: ONCOPLASTIC BREAST BILATERAL REDUCTION;  Surgeon: Lowery Estefana RAMAN, DO;  Location: [facility];  Service: Plastics;  Laterality: Bilateral;   CYSTOSCOPY  12/30/2011   Procedure: CYSTOSCOPY;  Surgeon: Ronal Elvie Cleotilde, MD;  Location: [facility];  Service: Gynecology;  Laterality: N/A;   REDUCTION MAMMAPLASTY Left 04/2020   ROBOTIC ASSISTED LAP VAGINAL HYSTERECTOMY     SALPINGOOPHORECTOMY  12/30/2011   Procedure: SALPINGO OOPHERECTOMY;  Surgeon:  Ronal Elvie Pinal, MD;  Location: [facility];  Service: Gynecology;  Laterality: Left;   TUBAL LIGATION      FAMILY HISTORY: Family History  Problem Relation Age of Onset   Hypertension Mother    Diabetes Mother    Thyroid  disease Mother    Arthritis Mother    Prostate cancer Maternal Uncle        dx unknown age   Cancer Maternal Uncle        unknown type; unknown age dx   Colon cancer Maternal Uncle        dx 57s   Lung cancer Paternal Grandfather        dx 57s; smoking hx   Breast cancer Cousin        maternal cousin-1st , diag 25s   Cancer Cousin        maternal cousin; GYN cancer, unknown age dx  As of November 2021 the patient's mother is 29 years old with no cancer history.  A maternal uncle had prostate cancer and another colon cancer.  A third had an unknown type of cancer.  A maternal cousin had breast cancer in her 44s and a second maternal cousin had an unknown gynecologic cancer.  The patient's father is 63 years old as of November 2021.  The paternal grandfather died from lung cancer at an advanced age.  There is no other family history of cancer to the patient's knowledge.   GYNECOLOGIC HISTORY:  Patient's last menstrual period was 11/17/2011. Menarche: 53 years old Age at first live birth: 53 years old GX P 1 LMP status post hysterectomy Contraceptive remotely, without complications HRT N/A  Hysterectomy? yes BSO? 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 She is single.  [name], 27 years old, is a horticulturist, commercial and his wife is in medical school in [place]  where her son is planning to move soon.  The patient has no grandchildren.  She lives by herself with two boxers.  She is not a church attender    ADVANCED DIRECTIVES: Not in place.  She tells me she intends to name [name] as healthcare power of attorney.  At the 06/07/2020 visit she was given the appropriate documents  to complete and notarize at her discretion.   HEALTH MAINTENANCE: Social History   Tobacco Use   Smoking status: Former    Current packs/day: 0.00    Types: Cigarettes    Quit date: 2000    Years since quitting: 25.8   Smokeless tobacco: Never  Vaping Use   Vaping status: Never Used  Substance Use Topics   Alcohol use: Not Currently    Alcohol/week: 2.0 - 3.0 standard drinks of alcohol    Types: 2 - 3 Standard drinks or equivalent per week    Comment: on weekends   Drug use: No     Colonoscopy: Cologard 01/2020 negative (performed at [facility])  PAP:  (s/p hysterectomy)  Bone density: n/a (age)   Allergies  Allergen Reactions   Phentermine Palpitations   Penicillins Other (See Comments)    Pt doesn't remember reaction to PCN- was in childhood   Sulfa Antibiotics Rash    Current Outpatient Medications  Medication  Sig Dispense Refill   atorvastatin (LIPITOR) 10 MG tablet Take 1 tablet (10 mg total) by mouth daily. 90 tablet 1   Cholecalciferol (D3-1000) 25 MCG (1000 UT) tablet Take 1,000 Units by mouth daily. 2 tablets daily     fenofibrate  160 MG tablet Take 1 tablet (160 mg total) by mouth daily. 90 tablet 1   hydrOXYzine  (VISTARIL ) 25 MG capsule Take 1 capsule (25 mg total) by mouth every 8 (eight) hours as needed. 30 capsule 0   Multiple Vitamin (MULTIVITAMIN PO) Take by mouth.     Omega-3 Fatty Acids (FISH OIL) 300 MG CAPS Take 2,000 mg by mouth in the morning and at bedtime.     tamoxifen  (NOLVADEX ) 20 MG tablet Take 1 tablet (20 mg total) by mouth daily. 90 tablet 3   tirzepatide  (ZEPBOUND ) 15 MG/0.5ML Pen Inject 15 mg into the skin once a week.     valACYclovir  (VALTREX ) 1000 MG tablet With symptom onset, take 2 tablets (two grams) and repeat in 12 hours. 30 tablet 4   No current facility-administered medications for this visit.    OBJECTIVE: White woman who appears well  Vitals:   04/27/24 1414  BP: 112/74  Pulse: 88  Resp: 17  Temp: 98.2 F (36.8 C)   SpO2: 99%      Body mass index is 31.75 kg/m.   Wt Readings from Last 3 Encounters:  04/27/24 190 lb 12.8 oz (86.5 kg)  04/18/24 189 lb (85.7 kg)  10/14/23 185 lb (83.9 kg)      ECOG FS:1 - Symptomatic but completely ambulatory  Physical Exam Constitutional:      Appearance: Normal appearance.  Cardiovascular:     Rate and Rhythm: Normal rate and regular rhythm.  Pulmonary:     Effort: Pulmonary effort is normal.     Breath sounds: Normal breath sounds.  Chest:     Comments: No palpable breast masses. No regional adenopathy. Musculoskeletal:        General: Normal range of motion.     Cervical back: Normal range of motion and neck supple. No rigidity.  Lymphadenopathy:     Cervical: No cervical adenopathy.  Neurological:     Mental Status: She is alert.  Psychiatric:        Mood and Affect: Mood normal.       LAB RESULTS:  CMP     Component Value Date/Time   NA 137 04/18/2024 1006   NA 147 (H) 08/22/2022 0935   K 4.3 04/18/2024 1006   CL 103 04/18/2024 1006   CO2 24 04/18/2024 1006   GLUCOSE 88 04/18/2024 1006   BUN 16 04/18/2024 1006   BUN 17 08/22/2022 0935   CREATININE 0.85 04/18/2024 1006   CREATININE 0.76 10/08/2021 1521   CREATININE 0.83 08/05/2016 1422   CALCIUM 9.7 04/18/2024 1006   PROT 7.7 04/18/2024 1006   PROT 7.6 08/22/2022 0935   ALBUMIN 4.9 04/18/2024 1006   ALBUMIN 4.7 08/22/2022 0935   AST 22 04/18/2024 1006   AST 15 10/08/2021 1521   ALT 18 04/18/2024 1006   ALT 16 10/08/2021 1521   ALKPHOS 43 04/18/2024 1006   BILITOT 0.4 04/18/2024 1006   BILITOT 0.3 08/22/2022 0935   BILITOT 0.2 (L) 10/08/2021 1521   GFRNONAA >60 10/08/2021 1521   GFRAA 117 02/10/2019 0920    No results found for: TOTALPROTELP, ALBUMINELP, A1GS, A2GS, BETS, BETA2SER, GAMS, MSPIKE, SPEI  Lab Results  Component Value Date   WBC 5.8 10/13/2023  NEUTROABS 3.1 10/13/2023   HGB 13.2 10/13/2023   HCT 38.7 10/13/2023   MCV 91.9 10/13/2023    PLT 336.0 10/13/2023    No results found for: LABCA2  No components found for: [id number]  No results for input(s): INR in the last 168 hours.  No results found for: LABCA2  No results found for: RJW800  No results found for: CAN125  No results found for: CAN153  No results found for: CA2729  No components found for: HGQUANT  No results found for: CEA1, CEA / No results found for: CEA1, CEA   No results found for: AFPTUMOR  No results found for: CHROMOGRNA  No results found for: KPAFRELGTCHN, LAMBDASER, KAPLAMBRATIO (kappa/lambda light chains)  No results found for: HGBA, HGBA2QUANT, HGBFQUANT, HGBSQUAN (Hemoglobinopathy evaluation)   No results found for: LDH  No results found for: IRON, TIBC, IRONPCTSAT (Iron and TIBC)  No results found for: FERRITIN  Urinalysis    Component Value Date/Time   BILIRUBINUR N 11/03/2017 1505   PROTEINUR N 11/03/2017 1505   UROBILINOGEN 0.2 11/03/2017 1505   NITRITE N 11/03/2017 1505   LEUKOCYTESUR Small (1+) (A) 11/03/2017 1505    STUDIES: No results found.   ELIGIBLE FOR AVAILABLE RESEARCH PROTOCOL: AET  ASSESSMENT: 53 y.o. [place] woman status post right breast biopsy 03/07/2020 for ductal carcinoma in situ, intermediate to high-grade, estrogen and progesterone receptor positive.  (1) genetics testing 03/28/2020 through the [facility] Common Hereditary Cancers Panel found no deleterious mutations in APC, ATM, AXIN2, BARD1, BMPR1A, BRCA1, BRCA2, BRIP1, CDH1, CDK4, CDKN2A (p14ARF), CDKN2A (p16INK4a), CHEK2, CTNNA1, DICER1, EPCAM (Deletion/duplication testing only), GREM1 (promoter region deletion/duplication testing only), KIT, MEN1, MLH1, MSH2, MSH3, MSH6, MUTYH, NBN, NF1, NHTL1, PALB2, PDGFRA, PMS2, POLD1, POLE, PTEN, RAD50, RAD51C, RAD51D, RNF43, SDHB, SDHC, SDHD, SMAD4, SMARCA4. STK11, TP53, TSC1, TSC2, and VHL.  The following genes were evaluated for sequence changes  only: SDHA and HOXB13 c.251G>A variant only.  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  (2) status post right lumpectomy 04/27/2020 for ductal carcinoma in situ, high-grade, with negative margins.  (a) status post bilateral breast reduction 04/27/2020  (3) adjuvant radiation 06/05/2020 through 07/05/2020 Site Technique Total Dose (Gy) Dose per Fx (Gy) Completed Fx Beam Energies  Breast, Right: Breast_Rt 3D 40.05/40.05 2.67 15/15 10X  Breast, Right: Breast_Rt_Bst 3D 10/10 2 5/5 6X, 10X   (4) started tamoxifen  07/24/2020   PLAN:  Ms. Karilyn is here for follow-up on adjuvant tamoxifen .    Assessment and Plan Assessment & Plan Personal history of breast cancer On tamoxifen  since February 2022, completion in 2027.  Recent mammogram normal. Liver function stable. No concern on exam today - Continue tamoxifen .  Hypercholesterolemia Cholesterol levels variable, managed with medication.  Hypertension Blood pressure normalized with weight loss.  She continues on GLP1 inhibitors for weight loss Encourage weight bearing exercises to maintain muscle mass despite weight loss.  Follow-up in one year or sooner if any changes or concerns arise.  Total time spent: 30 minutes *Total Encounter Time as defined by the Centers for Medicare and Medicaid Services includes, in addition to the face-to-face time of a patient visit (documented in the note above) non-face-to-face time: obtaining and reviewing outside history, ordering and reviewing medications, tests or procedures, care coordination (communications with other health care professionals or caregivers) and documentation in the medical record.

## 2024-04-27 NOTE — Progress Notes (Signed)
 [facility]  [phone] [phone]     ID: Catherine Santos DOB: 1970/11/04  MR#: [id number]  RDW#:[id number]  Patient Care Team: Wendolyn Jenkins Jansky, MD as PCP - General (Family Medicine) Tyree Nanetta SAILOR, RN as Oncology Nurse Navigator Curvin Deward MOULD, MD as Consulting Physician (General Surgery) Izell Domino, MD as Attending Physician (Radiation Oncology) Cleotilde Ronal RAMAN, MD as Consulting Physician (Gynecology) Dillingham, Estefana RAMAN, DO as Attending Physician (Plastic Surgery) Amber Stalls, MD  CHIEF COMPLAINT: noninvasive breast cancer, estrogen receptor positive  CURRENT TREATMENT: Tamoxifen   INTERVAL HISTORY:  Discussed the use of AI scribe software for clinical note transcription with the patient, who gave verbal consent to proceed.  History of Present Illness      REVIEW OF SYSTEMS:   COVID 19 VACCINATION STATUS: s/p Pfizer x 3 as of October 2022   HISTORY OF CURRENT ILLNESS: From Dr. Tauna original intake note:   Catherine Santos 53 y.o. female is here because of recent diagnosis of right breast ductal carcinoma in situ. Screening mammogram on 02/08/20 detected right breast calcifications. Diagnostic mammogram on 02/23/20 showed a 0.9cm group of right breast calcifications. Biopsy on 03/07/20 showed ductal carcinoma in situ, grade 2-3, ER+ 80%, PR+ 50%.  She underwent right lumpectomy on 04/27/2020 under Dr. Curvin. Pathology from the procedure [id number]) showed: ductal carcinoma in situ, high grade, with necrosis; margins negative.  The patient's subsequent history is as detailed below.   PAST MEDICAL HISTORY: Past Medical History:  Diagnosis Date   Abnormal Pap smear    Cancer (HCC)    breast cancer   Dog bite 04/2017   Family history of breast cancer 03/23/2020   Family history of prostate cancer 03/23/2020   Personal history of radiation therapy     PAST SURGICAL HISTORY: Past Surgical History:  Procedure  Laterality Date   ABDOMINAL HYSTERECTOMY     endometriosis   USO   BREAST BIOPSY Right 02/2020   BREAST LUMPECTOMY Right 04/2020   BREAST LUMPECTOMY WITH RADIOACTIVE SEED LOCALIZATION Right 04/27/2020   Procedure: RIGHT BREAST LUMPECTOMY WITH RADIOACTIVE SEED LOCALIZATION;  Surgeon: Curvin Deward MOULD, MD;  Location: [facility];  Service: General;  Laterality: Right;   BREAST REDUCTION SURGERY Bilateral 04/27/2020   Procedure: ONCOPLASTIC BREAST BILATERAL REDUCTION;  Surgeon: Lowery Estefana RAMAN, DO;  Location: [facility];  Service: Plastics;  Laterality: Bilateral;   CYSTOSCOPY  12/30/2011   Procedure: CYSTOSCOPY;  Surgeon: Ronal Elvie Cleotilde, MD;  Location: [facility];  Service: Gynecology;  Laterality: N/A;   REDUCTION MAMMAPLASTY Left 04/2020   ROBOTIC ASSISTED LAP VAGINAL HYSTERECTOMY     SALPINGOOPHORECTOMY  12/30/2011   Procedure: SALPINGO OOPHERECTOMY;  Surgeon: Ronal Elvie Cleotilde, MD;  Location: [facility];  Service: Gynecology;  Laterality: Left;   TUBAL LIGATION      FAMILY HISTORY: Family History  Problem Relation Age of Onset   Hypertension Mother    Diabetes Mother    Thyroid  disease Mother    Arthritis Mother    Prostate cancer Maternal Uncle        dx unknown age   Cancer Maternal Uncle        unknown type; unknown age dx   Colon cancer Maternal Uncle        dx 95s   Lung cancer Paternal Grandfather        dx 90s; smoking hx   Breast cancer Cousin        maternal cousin-1st , diag 42s  Cancer Cousin        maternal cousin; GYN cancer, unknown age dx  As of November 2021 the patient's mother is 39 years old with no cancer history.  A maternal uncle had prostate cancer and another colon cancer.  A third had an unknown type of cancer.  A maternal cousin had breast cancer in her 89s and a second maternal cousin had an unknown gynecologic cancer.  The patient's father is 44 years old as of November 2021.  The paternal grandfather died from lung  cancer at an advanced age.  There is no other family history of cancer to the patient's knowledge.   GYNECOLOGIC HISTORY:  Patient's last menstrual period was 11/17/2011. Menarche: 53 years old Age at first live birth: 53 years old GX P 1 LMP status post hysterectomy Contraceptive remotely, without complications HRT N/A  Hysterectomy? yes BSO? 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 She is single.  [name], 58 years old, is a horticulturist, commercial and his wife is in medical school in [place]  where her son is planning to move soon.  The patient has no grandchildren.  She lives by herself with two boxers.  She is not a church attender    ADVANCED DIRECTIVES: Not in place.  She tells me she intends to name [name] as healthcare power of attorney.  At the 06/07/2020 visit she was given the appropriate documents to complete and notarize at her discretion.   HEALTH MAINTENANCE: Social History   Tobacco Use   Smoking status: Former    Current packs/day: 0.00    Types: Cigarettes    Quit date: 2000    Years since quitting: 25.8   Smokeless tobacco: Never  Vaping Use   Vaping status: Never Used  Substance Use Topics   Alcohol use: Not Currently    Alcohol/week: 2.0 - 3.0 standard drinks of alcohol    Types: 2 - 3 Standard drinks or equivalent per week    Comment: on weekends   Drug use: No     Colonoscopy: Cologard 01/2020 negative (performed at [facility])  PAP:  (s/p hysterectomy)  Bone density: n/a (age)   Allergies  Allergen Reactions   Phentermine Palpitations   Penicillins Other (See Comments)    Pt doesn't remember reaction to PCN- was in childhood   Sulfa Antibiotics Rash    Current Outpatient Medications  Medication Sig Dispense Refill   atorvastatin (LIPITOR) 10 MG tablet Take 1 tablet (10 mg total) by mouth daily. 90 tablet 1   Cholecalciferol (D3-1000) 25 MCG (1000 UT) tablet  Take 1,000 Units by mouth daily. 2 tablets daily     fenofibrate  160 MG tablet Take 1 tablet (160 mg total) by mouth daily. 90 tablet 1   hydrOXYzine  (VISTARIL ) 25 MG capsule Take 1 capsule (25 mg total) by mouth every 8 (eight) hours as needed. 30 capsule 0   Multiple Vitamin (MULTIVITAMIN PO) Take by mouth.     Omega-3 Fatty Acids (FISH OIL) 300 MG CAPS Take 2,000 mg by mouth in the morning and at bedtime.     tamoxifen  (NOLVADEX ) 20 MG tablet Take 1 tablet (20 mg total) by mouth daily. 90 tablet 3   tirzepatide  (ZEPBOUND ) 15 MG/0.5ML Pen Inject 15 mg into the skin once a week. 6 mL 1   valACYclovir  (VALTREX ) 1000 MG tablet With symptom onset, take 2 tablets (two grams) and repeat in  12 hours. 30 tablet 4   No current facility-administered medications for this visit.    OBJECTIVE: White woman who appears well  Vitals:   04/27/24 1414  BP: 112/74  Pulse: 88  Resp: 17  Temp: 98.2 F (36.8 C)  SpO2: 99%      Body mass index is 31.75 kg/m.   Wt Readings from Last 3 Encounters:  04/27/24 190 lb 12.8 oz (86.5 kg)  04/18/24 189 lb (85.7 kg)  10/14/23 185 lb (83.9 kg)      ECOG FS:1 - Symptomatic but completely ambulatory  Physical Exam Constitutional:      Appearance: Normal appearance.  Cardiovascular:     Rate and Rhythm: Normal rate and regular rhythm.  Pulmonary:     Effort: Pulmonary effort is normal.     Breath sounds: Normal breath sounds.  Chest:       Comments: Small oil cyst in the right breast in lower outer quadrant at the edge of the scar. No change Musculoskeletal:        General: Normal range of motion.     Cervical back: Normal range of motion and neck supple. No rigidity.  Lymphadenopathy:     Cervical: No cervical adenopathy.  Neurological:     Mental Status: She is alert.  Psychiatric:        Mood and Affect: Mood normal.       LAB RESULTS:  CMP     Component Value Date/Time   NA 137 04/18/2024 1006   NA 147 (H) 08/22/2022 0935   K 4.3  04/18/2024 1006   CL 103 04/18/2024 1006   CO2 24 04/18/2024 1006   GLUCOSE 88 04/18/2024 1006   BUN 16 04/18/2024 1006   BUN 17 08/22/2022 0935   CREATININE 0.85 04/18/2024 1006   CREATININE 0.76 10/08/2021 1521   CREATININE 0.83 08/05/2016 1422   CALCIUM 9.7 04/18/2024 1006   PROT 7.7 04/18/2024 1006   PROT 7.6 08/22/2022 0935   ALBUMIN 4.9 04/18/2024 1006   ALBUMIN 4.7 08/22/2022 0935   AST 22 04/18/2024 1006   AST 15 10/08/2021 1521   ALT 18 04/18/2024 1006   ALT 16 10/08/2021 1521   ALKPHOS 43 04/18/2024 1006   BILITOT 0.4 04/18/2024 1006   BILITOT 0.3 08/22/2022 0935   BILITOT 0.2 (L) 10/08/2021 1521   GFRNONAA >60 10/08/2021 1521   GFRAA 117 02/10/2019 0920    No results found for: TOTALPROTELP, ALBUMINELP, A1GS, A2GS, BETS, BETA2SER, GAMS, MSPIKE, SPEI  Lab Results  Component Value Date   WBC 5.8 10/13/2023   NEUTROABS 3.1 10/13/2023   HGB 13.2 10/13/2023   HCT 38.7 10/13/2023   MCV 91.9 10/13/2023   PLT 336.0 10/13/2023    No results found for: LABCA2  No components found for: [id number]  No results for input(s): INR in the last 168 hours.  No results found for: LABCA2  No results found for: RJW800  No results found for: CAN125  No results found for: CAN153  No results found for: CA2729  No components found for: HGQUANT  No results found for: CEA1, CEA / No results found for: CEA1, CEA   No results found for: AFPTUMOR  No results found for: CHROMOGRNA  No results found for: KPAFRELGTCHN, LAMBDASER, KAPLAMBRATIO (kappa/lambda light chains)  No results found for: HGBA, HGBA2QUANT, HGBFQUANT, HGBSQUAN (Hemoglobinopathy evaluation)   No results found for: LDH  No results found for: IRON, TIBC, IRONPCTSAT (Iron and TIBC)  No results found for: FERRITIN  Urinalysis  Component Value Date/Time   BILIRUBINUR N 11/03/2017 1505   PROTEINUR N 11/03/2017 1505    UROBILINOGEN 0.2 11/03/2017 1505   NITRITE N 11/03/2017 1505   LEUKOCYTESUR Small (1+) (A) 11/03/2017 1505    STUDIES: No results found.   ELIGIBLE FOR AVAILABLE RESEARCH PROTOCOL: AET  ASSESSMENT: 53 y.o. [place] woman status post right breast biopsy 03/07/2020 for ductal carcinoma in situ, intermediate to high-grade, estrogen and progesterone receptor positive.  (1) genetics testing 03/28/2020 through the [facility] Common Hereditary Cancers Panel found no deleterious mutations in APC, ATM, AXIN2, BARD1, BMPR1A, BRCA1, BRCA2, BRIP1, CDH1, CDK4, CDKN2A (p14ARF), CDKN2A (p16INK4a), CHEK2, CTNNA1, DICER1, EPCAM (Deletion/duplication testing only), GREM1 (promoter region deletion/duplication testing only), KIT, MEN1, MLH1, MSH2, MSH3, MSH6, MUTYH, NBN, NF1, NHTL1, PALB2, PDGFRA, PMS2, POLD1, POLE, PTEN, RAD50, RAD51C, RAD51D, RNF43, SDHB, SDHC, SDHD, SMAD4, SMARCA4. STK11, TP53, TSC1, TSC2, and VHL.  The following genes were evaluated for sequence changes only: SDHA and HOXB13 c.251G>A variant only.  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  (2) status post right lumpectomy 04/27/2020 for ductal carcinoma in situ, high-grade, with negative margins.  (a) status post bilateral breast reduction 04/27/2020  (3) adjuvant radiation 06/05/2020 through 07/05/2020 Site Technique Total Dose (Gy) Dose per Fx (Gy) Completed Fx Beam Energies  Breast, Right: Breast_Rt 3D 40.05/40.05 2.67 15/15 10X  Breast, Right: Breast_Rt_Bst 3D 10/10 2 5/5 6X, 10X   (4) started tamoxifen  07/24/2020   PLAN:  Ms. Karilyn is here for follow-up on adjuvant tamoxifen .    Breast Cancer On Tamoxifen  since 2022 with no reported side effects.    Total time spent: 20 minutes *Total Encounter Time as defined by the Centers for Medicare and Medicaid Services includes, in addition to the face-to-face time of a patient visit (documented in the note above) non-face-to-face time: obtaining and  reviewing outside history, ordering and reviewing medications, tests or procedures, care coordination (communications with other health care professionals or caregivers) and documentation in the medical record.

## 2024-04-28 ENCOUNTER — Ambulatory Visit (HOSPITAL_BASED_OUTPATIENT_CLINIC_OR_DEPARTMENT_OTHER)
Admission: RE | Admit: 2024-04-28 | Discharge: 2024-04-28 | Disposition: A | Discharge status: Home / Self Care | Payer: Self-pay | Source: Ambulatory Visit | Attending: Family Medicine | Admitting: Family Medicine

## 2024-04-28 DIAGNOSIS — E782 Mixed hyperlipidemia: Secondary | ICD-10-CM | POA: Insufficient documentation

## 2024-04-30 NOTE — Progress Notes (Signed)
 Calcium score is 0 which is great!!! Still try the meds to see if triglycerides will improve

## 2024-05-02 NOTE — Progress Notes (Signed)
 Pt has read comments smk

## 2024-05-23 ENCOUNTER — Other Ambulatory Visit: Payer: Self-pay

## 2024-05-23 ENCOUNTER — Other Ambulatory Visit: Payer: Self-pay | Admitting: Family Medicine

## 2024-05-23 DIAGNOSIS — E782 Mixed hyperlipidemia: Secondary | ICD-10-CM

## 2024-05-23 MED ORDER — FENOFIBRATE 160 MG PO TABS: 160.0000 mg | ORAL_TABLET | Freq: Every day | ORAL | 1 refills | Status: AC

## 2024-05-24 ENCOUNTER — Other Ambulatory Visit: Payer: Self-pay | Admitting: Hematology and Oncology

## 2024-06-10 ENCOUNTER — Encounter: Payer: Self-pay | Admitting: Family Medicine

## 2024-06-13 ENCOUNTER — Other Ambulatory Visit: Payer: Self-pay

## 2024-06-13 DIAGNOSIS — E66811 Obesity, class 1: Secondary | ICD-10-CM

## 2024-06-13 MED ORDER — ZEPBOUND 15 MG/0.5ML ~~LOC~~ SOAJ: 15.0000 mg | SUBCUTANEOUS | 1 refills | Status: AC

## 2024-10-17 ENCOUNTER — Ambulatory Visit (HOSPITAL_BASED_OUTPATIENT_CLINIC_OR_DEPARTMENT_OTHER): Admitting: Obstetrics & Gynecology

## 2024-10-18 ENCOUNTER — Encounter: Admitting: Family Medicine

## 2025-04-28 ENCOUNTER — Inpatient Hospital Stay: Admitting: Hematology and Oncology
